# Patient Record
Sex: Male | Born: 1985
Health system: Southern US, Community
[De-identification: ages and names within clinical notes are randomized; demographics above are authoritative.]

## PROBLEM LIST (undated history)

## (undated) DIAGNOSIS — F840 Autistic disorder: Secondary | ICD-10-CM

## (undated) DIAGNOSIS — F909 Attention-deficit hyperactivity disorder, unspecified type: Secondary | ICD-10-CM

## (undated) DIAGNOSIS — R630 Anorexia: Secondary | ICD-10-CM

## (undated) DIAGNOSIS — R32 Unspecified urinary incontinence: Secondary | ICD-10-CM

## (undated) DIAGNOSIS — I10 Essential (primary) hypertension: Secondary | ICD-10-CM

## (undated) DIAGNOSIS — K5900 Constipation, unspecified: Secondary | ICD-10-CM

## (undated) HISTORY — DX: Attention-deficit hyperactivity disorder, unspecified type: F90.9

## (undated) HISTORY — PX: MOUTH SURGERY: SHX715

---
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
## 2007-01-25 ENCOUNTER — Emergency Department (HOSPITAL_COMMUNITY): Admission: EM | Admit: 2007-01-25 | Discharge: 2007-01-25 | Payer: Self-pay | Admitting: Emergency Medicine

## 2007-02-05 ENCOUNTER — Ambulatory Visit (HOSPITAL_COMMUNITY): Payer: Self-pay | Admitting: Psychiatry

## 2007-05-01 ENCOUNTER — Ambulatory Visit (HOSPITAL_COMMUNITY): Payer: Self-pay | Admitting: Psychiatry

## 2007-07-30 ENCOUNTER — Ambulatory Visit (HOSPITAL_COMMUNITY): Payer: Self-pay | Admitting: Psychiatry

## 2007-12-19 ENCOUNTER — Ambulatory Visit (HOSPITAL_COMMUNITY): Payer: Self-pay | Admitting: Psychiatry

## 2008-03-19 ENCOUNTER — Ambulatory Visit (HOSPITAL_COMMUNITY): Payer: Self-pay | Admitting: Psychiatry

## 2008-06-20 ENCOUNTER — Ambulatory Visit (HOSPITAL_COMMUNITY): Payer: Self-pay | Admitting: Psychiatry

## 2008-09-26 ENCOUNTER — Ambulatory Visit (HOSPITAL_COMMUNITY): Payer: Self-pay | Admitting: Psychiatry

## 2008-12-26 ENCOUNTER — Ambulatory Visit (HOSPITAL_COMMUNITY): Payer: Self-pay | Admitting: Psychiatry

## 2009-03-27 ENCOUNTER — Ambulatory Visit (HOSPITAL_COMMUNITY): Payer: Self-pay | Admitting: Psychiatry

## 2009-06-23 ENCOUNTER — Ambulatory Visit (HOSPITAL_COMMUNITY): Payer: Self-pay | Admitting: Psychiatry

## 2009-09-23 ENCOUNTER — Ambulatory Visit (HOSPITAL_COMMUNITY): Payer: Self-pay | Admitting: Psychiatry

## 2009-12-29 ENCOUNTER — Ambulatory Visit (HOSPITAL_COMMUNITY): Payer: Self-pay | Admitting: Psychiatry

## 2010-02-10 ENCOUNTER — Ambulatory Visit (HOSPITAL_COMMUNITY): Payer: Self-pay | Admitting: Psychiatry

## 2010-03-30 ENCOUNTER — Encounter (HOSPITAL_COMMUNITY): Payer: Medicaid Other | Admitting: Physician Assistant

## 2010-03-30 DIAGNOSIS — F909 Attention-deficit hyperactivity disorder, unspecified type: Secondary | ICD-10-CM

## 2010-04-21 ENCOUNTER — Inpatient Hospital Stay (HOSPITAL_COMMUNITY)
Admission: EM | Admit: 2010-04-21 | Discharge: 2010-04-22 | DRG: 918 | Disposition: A | Discharge status: Home / Self Care | Payer: Medicaid Other | Attending: Internal Medicine | Admitting: Internal Medicine

## 2010-04-21 DIAGNOSIS — T43501A Poisoning by unspecified antipsychotics and neuroleptics, accidental (unintentional), initial encounter: Principal | ICD-10-CM | POA: Diagnosis present

## 2010-04-21 DIAGNOSIS — T43591A Poisoning by other antipsychotics and neuroleptics, accidental (unintentional), initial encounter: Secondary | ICD-10-CM | POA: Diagnosis present

## 2010-04-21 DIAGNOSIS — F840 Autistic disorder: Secondary | ICD-10-CM | POA: Diagnosis present

## 2010-04-21 LAB — COMPREHENSIVE METABOLIC PANEL
AST: 28 U/L (ref 0–37)
BUN: 8 mg/dL (ref 6–23)
CO2: 26 mEq/L (ref 19–32)
Calcium: 9.7 mg/dL (ref 8.4–10.5)
Creatinine, Ser: 0.79 mg/dL (ref 0.4–1.5)
GFR calc Af Amer: >60 mL/min (ref >60)
GFR calc non Af Amer: >60 mL/min (ref >60)
Glucose, Bld: 97 mg/dL (ref 70–99)
Total Bilirubin: 0.5 mg/dL (ref 0.3–1.2)

## 2010-04-21 LAB — DIFFERENTIAL
Basophils Absolute: 0 10*3/uL (ref 0.0–0.1)
Eosinophils Relative: 1 % (ref 0–5)
Lymphocytes Relative: 20 % (ref 12–46)
Monocytes Absolute: 0.5 10*3/uL (ref 0.1–1.0)
Monocytes Relative: 5 % (ref 3–12)

## 2010-04-21 LAB — CBC
HCT: 39.6 % (ref 39.0–52.0)
MCH: 29.2 pg (ref 26.0–34.0)
MCHC: 34.8 g/dL (ref 30.0–36.0)
RDW: 13.1 % (ref 11.5–15.5)

## 2010-04-21 LAB — SALICYLATE LEVEL: Salicylate Lvl: <4 mg/dL (ref 2.8–20.0)

## 2010-04-21 LAB — ETHANOL: Alcohol, Ethyl (B): <5 mg/dL (ref 0–10)

## 2010-04-21 LAB — MRSA PCR SCREENING: MRSA by PCR: NEGATIVE

## 2010-04-22 LAB — CBC
Platelets: 208 10*3/uL (ref 150–400)
RBC: 4.69 MIL/uL (ref 4.22–5.81)
RDW: 13.4 % (ref 11.5–15.5)
WBC: 6.1 10*3/uL (ref 4.0–10.5)

## 2010-04-22 LAB — DIFFERENTIAL
Basophils Absolute: 0 10*3/uL (ref 0.0–0.1)
Basophils Relative: 0 % (ref 0–1)
Eosinophils Absolute: 0.1 10*3/uL (ref 0.0–0.7)
Eosinophils Relative: 1 % (ref 0–5)
Neutrophils Relative %: 59 % (ref 43–77)

## 2010-04-22 LAB — COMPREHENSIVE METABOLIC PANEL
AST: 26 U/L (ref 0–37)
Albumin: 3.9 g/dL (ref 3.5–5.2)
Alkaline Phosphatase: 67 U/L (ref 39–117)
BUN: 5 mg/dL — ABNORMAL LOW (ref 6–23)
Chloride: 109 mEq/L (ref 96–112)
GFR calc Af Amer: >60 mL/min (ref >60)
Potassium: 4.1 mEq/L (ref 3.5–5.1)
Total Bilirubin: 0.4 mg/dL (ref 0.3–1.2)
Total Protein: 6.6 g/dL (ref 6.0–8.3)

## 2010-04-23 NOTE — H&P (Signed)
  Logan [name], [name]              ACCOUNT NO.:  [id number]  MEDICAL RECORD NO.:  [id number]           PATIENT TYPE:  I  LOCATION:  IC04                          FACILITY:  [facility]  PHYSICIAN:  Logan [name], M.D.DATE OF BIRTH:  10-Jan-1986  DATE OF ADMISSION:  04/21/2010 DATE OF DISCHARGE:  LH                             HISTORY & PHYSICAL   CHIEF COMPLAINT:  Accidental overdose of Zyprexa.  HISTORY OF PRESENT ILLNESS:  This 25 year old autistic man was noted by his mother to have taken approximately 20-22 Zyprexa 15 mg tablets approximately 8 hours ago.  He found where his mother had placed these pills and must have taken them.  The patient is moderate to severely autistic and requires care by his mother.  He is normally ambulatory. The patient presented to the ER in a awake state and somewhat agitated. By the time I saw the patient, the patient has been given intravenous lorazepam and now is sleeping.  This was clearly not a suicidal attempt by this patient.  PAST MEDICAL HISTORY:  Autism.  SURGERIES:  No surgeries.  No other serious illnesses.  SOCIAL HISTORY:  The patient lives with his mother.  The patient also has another brother 88 years old who is also autistic.  FAMILY HISTORY:  As above.  MEDICATIONS: 1. Zyprexa 15 mg q.i.d. 2. Trazodone 50 mg nightly p.r.n.  ALLERGIES:  None.  REVIEW OF SYSTEMS:  The patient is some sleeping and unable to give me any clear review whatsoever and usually he is not communicative when awake.  PHYSICAL EXAMINATION:  VITAL SIGNS:  The patient is afebrile, temperature 132/90, pulse 100 and sinus rhythm, respiratory rate 14, saturation 100% on room air. GENERAL:  He looks quite comfortable at rest.  There is no peripheral or central cyanosis.  Peripheries are warm.  There is no clubbing.  There is no jaundice. CARDIOVASCULAR:  Heart sounds are present and normal without murmurs. RESPIRATORY:  Lung fields are clear. ABDOMEN:   Soft and nontender with no hepatosplenomegaly. NEUROLOGIC:  He is somnolescent at this moment but when he was observed earlier by the ER physician, there was no evidence of neurological abnormalities.  In fact he is not cooperative with the physical exam but eventually did cooperate with taking blood and placing an intravenous access.  This is apparently usual per the patient's mother.  INVESTIGATIONS:  Lab work shows hemoglobin 13.8, white blood cell count 10.4, platelets 232.  Salicylate level less than 4.0.  Acetaminophen level less than 10.0.  Sodium 141, potassium 4.0, bicarbonate 26, glucose 97, BUN 8, creatinine 0.79, alcohol level less than 5.  PROBLEM LIST: 1. Accidental overdose of olanzapine. 2. Autism.  PLAN: 1. Admit to telemetry. 2. Intravenous fluids. 3. Monitor for hypotension and tachycardia per Poison Control. 4. Further recommendations will depend on the patient's hospital     progress.     Logan [name], M.D.     NCG/MEDQ  D:  04/21/2010  T:  04/22/2010  Job:  [id number]  cc:   Annia [name]. [place], MDFax: [phone]  Electronically Signed by Logan [name] M.D. on 04/22/2010 02:22:51 PM

## 2010-04-23 NOTE — Discharge Summary (Signed)
  Logan [name], Logan [name]              ACCOUNT NO.:  [id number]  MEDICAL RECORD NO.:  [id number]           PATIENT TYPE:  I  LOCATION:  IC04                          FACILITY:  [facility]  PHYSICIAN:  Wilson Singer, M.D.DATE OF BIRTH:  1985-05-18  DATE OF ADMISSION:  04/21/2010 DATE OF DISCHARGE:  LH                              DISCHARGE SUMMARY   FINAL DISCHARGE DIAGNOSIS:  Accidental overdose of olanzapine 20-22 15 mg tablets.  CONDITION ON DISCHARGE:  Stable.  HISTORY:  This 25 year old Autistic man came in to the hospital yesterday having per his mother taken accidental overdose of olanzapine. The patient is severely autistic.  At the time of this discharge summary dictation, the patient is now 24 hours post probable ingestion of olanzapine and he has been stable overnight with no evidence of hypotension or significant tachycardia or arrhythmia.  Poison control was contacted yesterday and they alerted [place] to watch for these problems. There have been no such problems and poison control has been contacted again today and they feel that he is stable to be discharged based on my clinical findings.  They suggested that any abnormal movements or rigidity would be something to monitor.  His electrocardiogram this morning shows a sinus tachycardia and a QTC time is 452 milliseconds, in other words less than 500 milliseconds and I feel that he is safe to be discharged.  PHYSICAL EXAMINATION:  VITAL SIGNS:  Today, temperature 97.6, blood pressure 109/70, pulse 102 and saturations 99% on room air. CARDIAC:  Heart sounds are present and normal with sinus tachycardia. LUNGS:  Lung fields are clear. ABDOMEN:  Soft and nontender. NEUROLOGICAL:  He is alert and he appears to be back to his baseline state according to his mother.  He is normally not very communicative.  LABORATORY WORK:  Today show a sodium of 145, potassium 4.1, bicarbonate 27, BUN 5, creatinine 0.85, glucose 95, hemoglobin  13.4, white blood cell count 6.1, platelets 208.  DISPOSITION:  The patient is stable to be discharged home and I have told his mother that as far as today is concerned, I would probably not recommend that she gave him any Zyprexa as half life can be up to 50 hours.  Probably, it is reasonable to start his usual dose of Zyprexa 15 mg 4 times a day tomorrow.  She will watch him closely as she usually does.     Wilson Singer, M.D.     NCG/MEDQ  D:  04/22/2010  T:  04/22/2010  Job:  [id number]  cc:   Annia Friendly. Loleta Chance, MD Fax: [phone]  Electronically Signed by Lilly Cove M.D. on 04/22/2010 02:23:10 PM

## 2010-06-22 ENCOUNTER — Encounter (HOSPITAL_COMMUNITY): Payer: Medicaid Other | Admitting: Physician Assistant

## 2010-06-22 DIAGNOSIS — F909 Attention-deficit hyperactivity disorder, unspecified type: Secondary | ICD-10-CM

## 2010-09-21 ENCOUNTER — Encounter (HOSPITAL_COMMUNITY): Payer: Medicare Other | Admitting: Physician Assistant

## 2010-09-21 DIAGNOSIS — F909 Attention-deficit hyperactivity disorder, unspecified type: Secondary | ICD-10-CM

## 2010-12-22 ENCOUNTER — Encounter (HOSPITAL_COMMUNITY): Payer: Medicare Other | Admitting: Physician Assistant

## 2011-01-04 ENCOUNTER — Ambulatory Visit (HOSPITAL_COMMUNITY): Payer: Medicare Other | Admitting: Physician Assistant

## 2011-01-28 ENCOUNTER — Other Ambulatory Visit (HOSPITAL_COMMUNITY): Payer: Self-pay | Admitting: Physician Assistant

## 2011-01-28 ENCOUNTER — Other Ambulatory Visit (HOSPITAL_COMMUNITY): Payer: Self-pay

## 2011-01-28 DIAGNOSIS — F39 Unspecified mood [affective] disorder: Secondary | ICD-10-CM

## 2011-01-28 MED ORDER — TRAZODONE HCL 50 MG PO TABS: 50.0000 mg | ORAL_TABLET | Freq: Every day | ORAL | Status: DC

## 2011-02-17 ENCOUNTER — Other Ambulatory Visit (HOSPITAL_COMMUNITY): Payer: Self-pay | Admitting: Physician Assistant

## 2011-02-17 MED ORDER — OLANZAPINE 15 MG PO TBDP: 15.0000 mg | ORAL_TABLET | Freq: Four times a day (QID) | ORAL | Status: DC | PRN

## 2011-03-02 ENCOUNTER — Ambulatory Visit (HOSPITAL_COMMUNITY): Payer: Medicare Other | Admitting: Physician Assistant

## 2011-03-02 ENCOUNTER — Ambulatory Visit (INDEPENDENT_AMBULATORY_CARE_PROVIDER_SITE_OTHER): Payer: Medicare Other | Admitting: Physician Assistant

## 2011-03-02 DIAGNOSIS — F848 Other pervasive developmental disorders: Secondary | ICD-10-CM

## 2011-03-02 DIAGNOSIS — F909 Attention-deficit hyperactivity disorder, unspecified type: Secondary | ICD-10-CM | POA: Diagnosis not present

## 2011-03-02 MED ORDER — AMPHETAMINE-DEXTROAMPHETAMINE 20 MG PO TABS: 20.0000 mg | ORAL_TABLET | Freq: Every day | ORAL | Status: DC

## 2011-03-07 NOTE — Progress Notes (Signed)
   [facility] Follow-up Outpatient Visit  Logan [name] 1985/02/25  Date: 03/02/11   Subjective: Logan [name] was seen with his mother and brother today. Logan [name]'s mother reports that overall Logan [name] is doing well, but today he is misbehaving. She has decreased the dose of Adderall that she has been giving him and he seems to be doing well  There were no vitals filed for this visit.  Mental Status Examination  Appearance: Casual Alert: Yes Attention: poor Cooperative: No Eye Contact: Poor Speech: Absent Psychomotor Activity: Increased Memory/Concentration: Absent Oriented: Unable to determine Mood: Anxious Affect: Congruent Thought Processes and Associations: Unable to determine Fund of Knowledge: Poor Thought Content:  Insight: Poor Judgement: Poor  Diagnosis: ADHD, combined type; autism  Treatment Plan: Decrease dose of Adderall. Continue other medications as prescribed. Followup in 3 months  Kit Mollett, PA

## 2011-03-22 ENCOUNTER — Other Ambulatory Visit (HOSPITAL_COMMUNITY): Payer: Self-pay | Admitting: Psychology

## 2011-03-22 MED ORDER — OLANZAPINE 15 MG PO TBDP: 15.0000 mg | ORAL_TABLET | Freq: Four times a day (QID) | ORAL | Status: DC | PRN

## 2011-04-20 ENCOUNTER — Other Ambulatory Visit (HOSPITAL_COMMUNITY): Payer: Self-pay | Admitting: *Deleted

## 2011-04-20 DIAGNOSIS — F901 Attention-deficit hyperactivity disorder, predominantly hyperactive type: Secondary | ICD-10-CM

## 2011-04-20 MED ORDER — OLANZAPINE 15 MG PO TBDP: 15.0000 mg | ORAL_TABLET | Freq: Four times a day (QID) | ORAL | Status: DC | PRN

## 2011-05-22 ENCOUNTER — Emergency Department (HOSPITAL_COMMUNITY)
Admission: EM | Admit: 2011-05-22 | Discharge: 2011-05-22 | Disposition: A | Discharge status: Home / Self Care | Payer: Medicare Other | Attending: Emergency Medicine | Admitting: Emergency Medicine

## 2011-05-22 ENCOUNTER — Encounter (HOSPITAL_COMMUNITY): Payer: Self-pay

## 2011-05-22 DIAGNOSIS — F840 Autistic disorder: Secondary | ICD-10-CM | POA: Diagnosis not present

## 2011-05-22 DIAGNOSIS — S61209A Unspecified open wound of unspecified finger without damage to nail, initial encounter: Secondary | ICD-10-CM | POA: Diagnosis not present

## 2011-05-22 DIAGNOSIS — S61219A Laceration without foreign body of unspecified finger without damage to nail, initial encounter: Secondary | ICD-10-CM

## 2011-05-22 DIAGNOSIS — W268XXA Contact with other sharp object(s), not elsewhere classified, initial encounter: Secondary | ICD-10-CM | POA: Insufficient documentation

## 2011-05-22 HISTORY — DX: Autistic disorder: F84.0

## 2011-05-22 MED ORDER — TETANUS-DIPHTH-ACELL PERTUSSIS 5-2.5-18.5 LF-MCG/0.5 IM SUSP
0.5000 mL | Freq: Once | INTRAMUSCULAR | Status: AC
  Administered 2011-05-22: 0.5 mL via INTRAMUSCULAR
  Filled 2011-05-22: qty 0.5

## 2011-05-22 NOTE — ED Notes (Signed)
Cut left hand index finger on metal can

## 2011-05-22 NOTE — Discharge Instructions (Signed)
Laceration Care, Adult °A laceration is a cut that goes through all layers of the skin. The cut goes into the tissue beneath the skin. °HOME CARE °For stitches (sutures) or staples: °· Keep the cut clean and dry.  °· If you have a bandage (dressing), change it at least once a day. Change the bandage if it gets wet or dirty, or as told by your doctor.  °· Wash the cut with soap and water 2 times a day. Rinse the cut with water. Pat it dry with a clean towel.  °· Put a thin layer of medicated cream on the cut as told by your doctor.  °· You may shower after the first 24 hours. Do not soak the cut in water until the stitches are removed.  °· Only take medicines as told by your doctor.  °· Have your stitches or staples removed as told by your doctor.  °For skin adhesive strips: °· Keep the cut clean and dry.  °· Do not get the strips wet. You may take a bath, but be careful to keep the cut dry.  °· If the cut gets wet, pat it dry with a clean towel.  °· The strips will fall off on their own. Do not remove the strips that are still stuck to the cut.  °For wound glue: °· You may shower or take baths. Do not soak or scrub the cut. Do not swim. Avoid heavy sweating until the glue falls off on its own. After a shower or bath, pat the cut dry with a clean towel.  °· Do not put medicine on your cut until the glue falls off.  °· If you have a bandage, do not put tape over the glue.  °· Avoid lots of sunlight or tanning lamps until the glue falls off. Put sunscreen on the cut for the first year to reduce your scar.  °· The glue will fall off on its own. Do not pick at the glue.  °You may need a tetanus shot if: °· You cannot remember when you had your last tetanus shot.  °· You have never had a tetanus shot.  °If you need a tetanus shot and you choose not to have one, you may get tetanus. Sickness from tetanus can be serious. °GET HELP RIGHT AWAY IF:  °· Your pain does not get better with medicine.  °· Your arm, hand, leg, or  foot loses feeling (numbness) or changes color.  °· Your cut is bleeding.  °· Your joint feels weak, or you cannot use your joint.  °· You have painful lumps on your body.  °· Your cut is red, puffy (swollen), or painful.  °· You have a red line on the skin near the cut.  °· You have yellowish-white fluid (pus) coming from the cut.  °· You have a fever.  °· You have a bad smell coming from the cut or bandage.  °· Your cut breaks open before or after stitches are removed.  °· You notice something coming out of the cut, such as wood or glass.  °· You cannot move a finger or toe.  °MAKE SURE YOU:  °· Understand these instructions.  °· Will watch your condition.  °· Will get help right away if you are not doing well or get worse.  °Document Released: 07/20/2007 Document Revised: 01/20/2011 Document Reviewed: 07/27/2010 °[employer]® Patient Information ©2012 [employer]. °

## 2011-05-22 NOTE — ED Notes (Addendum)
Severely autistic young man presents with a small laceration to the left index finger. Laceration obtained from a fruit can top. Bleeding controlled with gauze. Mother placed a sock over pt's hand so as he would not "pick at cut".  Pt is standing in corner of room with back to nurse. Pt is standing with eyes closed and drooling. Mother of said pt states " I gave him, his medicine before we came". Mother goes on to state the last time he was here "he had to be sedated and restrained". Pt is very strong and continued to pull his hand away so as it made it difficult to examine finger laceration.

## 2011-05-22 NOTE — ED Notes (Signed)
Pt held by mom and 2 others to apply steri-strips and dermabond. Pt tolerated well.

## 2011-05-25 ENCOUNTER — Other Ambulatory Visit (HOSPITAL_COMMUNITY): Payer: Self-pay | Admitting: *Deleted

## 2011-05-25 DIAGNOSIS — F39 Unspecified mood [affective] disorder: Secondary | ICD-10-CM

## 2011-05-25 DIAGNOSIS — F901 Attention-deficit hyperactivity disorder, predominantly hyperactive type: Secondary | ICD-10-CM

## 2011-05-25 MED ORDER — TRAZODONE HCL 50 MG PO TABS: 50.0000 mg | ORAL_TABLET | Freq: Every day | ORAL | Status: DC

## 2011-05-25 MED ORDER — OLANZAPINE 15 MG PO TBDP: 15.0000 mg | ORAL_TABLET | Freq: Four times a day (QID) | ORAL | Status: DC | PRN

## 2011-05-25 NOTE — ED Provider Notes (Signed)
History     CSN: [id number]  Arrival date & time 05/22/11  1255   First MD Initiated Contact with Patient 05/22/11 1315      Chief Complaint  Patient presents with  . Laceration    (Consider location/radiation/quality/duration/timing/severity/associated sxs/prior treatment) HPI Comments: Patient with a hx of autism, mother c/o laceration to his left index finger after trying to open a metal can.  States she was unable to control the bleeding at home.    Patient is a 26 y.o. male presenting with skin laceration. The history is provided by a parent.  Laceration  The incident occurred less than 1 hour ago. Pain location: left index finger. The laceration is 1 cm in size. The laceration mechanism was a a metal edge. The patient is experiencing no pain. He reports no foreign bodies present. His tetanus status is unknown.    Past Medical History  Diagnosis Date  . Autism     History reviewed. No pertinent past surgical history.  No family history on file.  History  Substance Use Topics  . Smoking status: Never Smoker   . Smokeless tobacco: Not on file  . Alcohol Use: No      Review of Systems  Unable to perform ROS   Allergies  Review of patient's allergies indicates not on file.  Home Medications   Current Outpatient Rx  Name Route Sig Dispense Refill  . AMPHETAMINE-DEXTROAMPHETAMINE 20 MG PO TABS Oral Take 1 tablet (20 mg total) by mouth daily. 30 tablet 0  . AMPHETAMINE-DEXTROAMPHETAMINE 20 MG PO TABS Oral Take 1 tablet (20 mg total) by mouth daily. 30 tablet 0    Fill after 03/29/11  . AMPHETAMINE-DEXTROAMPHETAMINE 20 MG PO TABS Oral Take 1 tablet (20 mg total) by mouth daily. 30 tablet 0    Fill after 04/26/11  . OLANZAPINE 15 MG PO TBDP Oral Take 1 tablet (15 mg total) by mouth 4 (four) times daily as needed. 120 tablet 0  . TRAZODONE HCL 50 MG PO TABS Oral Take 1 tablet (50 mg total) by mouth at bedtime. 30 tablet 2    BP 114/76  Pulse 110  Temp 98 F (36.7  C)  Resp 20  Wt 145 lb (65.772 kg)  SpO2 100%  Physical Exam  Nursing note and vitals reviewed. Constitutional: He appears well-developed and well-nourished. No distress.  Musculoskeletal: He exhibits no edema and no tenderness.       Left hand: He exhibits laceration. He exhibits normal range of motion, no bony tenderness, normal two-point discrimination, normal capillary refill, no deformity and no swelling. normal sensation noted. Normal strength noted.       Hands:      1.5 cm superfical lac to the palmar aspect of the distal left index finger.  Mild, persistent bleeding.  Bleeding controlled with pressure but Pt continues to move the  finger and bleeding resumes.    Neurological: He is alert.  Skin: Skin is warm and dry.       See MS exam    ED Course  Procedures (including critical care time)    1. Laceration of finger    Wound was cleaned by me, hemostasis obtained with application of quick clot dressing.    Td updated   MDM    2 cm superfical laceration to the left index finger with persistent bleeding.  Pt maintained full ROM , sensation to the finger.  No nail injury.  Mother agrees to remove the dressing within 24 hrs,  and return here if needed  Wound(s) explored with adequate hemostasis through ROM, no apparent gross foreign body retained, no significant involvement of deep structures such as bone / joint / tendon / or neurovascular involvement noted.  Baseline Strength and Sensation to affected extremity(ies) with normal light touch for Pt, distal NVI with CR< 2 secs and pulse(s) intact to affected extremity(ies).   Patient / Family / Caregiver understand and agree with initial ED impression and plan with expectations set for ED visit. Pt stable in ED with no significant deterioration in condition. Pt feels improved after observation and/or treatment in ED.    Jamyia Fortune L. Shahed Yeoman, [place] 05/25/11 1351

## 2011-05-27 NOTE — ED Provider Notes (Signed)
Medical screening examination/treatment/procedure(s) were performed by non-physician practitioner and as supervising physician I was immediately available for consultation/collaboration.   Lavi Sheehan L Amber Guthridge, MD 05/27/11 1402 

## 2011-05-31 ENCOUNTER — Ambulatory Visit (INDEPENDENT_AMBULATORY_CARE_PROVIDER_SITE_OTHER): Payer: Medicare Other | Admitting: Physician Assistant

## 2011-05-31 DIAGNOSIS — F909 Attention-deficit hyperactivity disorder, unspecified type: Secondary | ICD-10-CM | POA: Insufficient documentation

## 2011-05-31 DIAGNOSIS — F840 Autistic disorder: Secondary | ICD-10-CM | POA: Insufficient documentation

## 2011-05-31 MED ORDER — AMPHETAMINE-DEXTROAMPHETAMINE 20 MG PO TABS: 20.0000 mg | ORAL_TABLET | Freq: Every day | ORAL | Status: DC

## 2011-05-31 NOTE — Progress Notes (Signed)
   [facility] Follow-up Outpatient Visit  Jakeob Tullis Oguin 10-Nov-1985  Date: 05/31/2011   Subjective: Dilon presents today with his mother and brother to followup on medications prescribed for ADHD and autism. Mother reports that his behavior has been poor, but he is rarely aggressive. They had to go to the emergency room because he cut his finger on a tin can and they were unable to control the bleeding. Mother reports that his sleep is good, but his appetite is somewhat down, and he has lost some weight.  There were no vitals filed for this visit.  Mental Status Examination  Appearance: Well groomed and casually dressed Alert: Yes Attention: good  Cooperative: Yes Eye Contact: None Speech: Absent Psychomotor Activity: Normal Memory/Concentration: Undetermined Oriented: Undetermined Mood: Dysphoric Affect: Congruent Thought Processes and Associations: Undetermined Fund of Knowledge: Poor Thought Content: Undetermined Insight: Poor Judgement: Poor  Diagnosis: ADHD, combined type; autism  Treatment Plan: We will continue his Adderall 20 mg daily, trazodone 50 mg at bedtime, and Zyprexa situs 15 mg 4 times daily as needed. He will return for followup in 3 months.  Stephnie Parlier, PA-C

## 2011-06-28 ENCOUNTER — Other Ambulatory Visit (HOSPITAL_COMMUNITY): Payer: Self-pay | Admitting: *Deleted

## 2011-06-28 DIAGNOSIS — F901 Attention-deficit hyperactivity disorder, predominantly hyperactive type: Secondary | ICD-10-CM

## 2011-06-28 MED ORDER — OLANZAPINE 15 MG PO TBDP: 15.0000 mg | ORAL_TABLET | Freq: Four times a day (QID) | ORAL | Status: DC | PRN

## 2011-07-27 ENCOUNTER — Other Ambulatory Visit (HOSPITAL_COMMUNITY): Payer: Self-pay

## 2011-07-27 DIAGNOSIS — F39 Unspecified mood [affective] disorder: Secondary | ICD-10-CM

## 2011-07-27 MED ORDER — TRAZODONE HCL 50 MG PO TABS: 50.0000 mg | ORAL_TABLET | Freq: Every day | ORAL | Status: DC

## 2011-08-16 ENCOUNTER — Ambulatory Visit (INDEPENDENT_AMBULATORY_CARE_PROVIDER_SITE_OTHER): Payer: Medicare Other | Admitting: Physician Assistant

## 2011-08-16 DIAGNOSIS — F909 Attention-deficit hyperactivity disorder, unspecified type: Secondary | ICD-10-CM | POA: Diagnosis not present

## 2011-08-16 DIAGNOSIS — F848 Other pervasive developmental disorders: Secondary | ICD-10-CM | POA: Diagnosis not present

## 2011-08-16 MED ORDER — AMPHETAMINE-DEXTROAMPHETAMINE 20 MG PO TABS: 20.0000 mg | ORAL_TABLET | Freq: Every day | ORAL | Status: DC

## 2011-08-16 NOTE — Progress Notes (Signed)
   [facility] Follow-up Outpatient Visit  Logan [name] 1985/11/06  Date: 08/16/2011   Subjective: Logan [name] presents with his mother and brother today to followup on his treatment for ADHD and autism. Mother reports that has been giving her a hard time recently, and states "I figured out what is wrong with him. He is just mean." She reports that all in all he is doing well. He sleeps well, and his appetite is good. He does not like to leave the house, but once he gets out he is fine.  There were no vitals filed for this visit.  Mental Status Examination  Appearance: Fairly groomed and casually dressed Alert: Yes Attention: fair  Cooperative: No Eye Contact: Fair Speech: Absent Psychomotor Activity: Normal Memory/Concentration: Undetermined Oriented: Undetermined Mood: Euthymic Affect: Congruent Thought Processes and Associations: Undetermined Fund of Knowledge: Poor Thought Content: Undetermined Insight: Poor Judgement: Fair  Diagnosis: ADHD, autism spectrum disorder  Treatment Plan: We will continue his Adderall 20 mg daily, trazodone 50 mg at bedtime, and Zyprexa Zydis 15 mg 4 times daily as needed. He'll return for followup in 3 months  Rasa Degrazia, PA-C

## 2011-08-30 ENCOUNTER — Other Ambulatory Visit (HOSPITAL_COMMUNITY): Payer: Self-pay | Admitting: Psychology

## 2011-08-30 DIAGNOSIS — F39 Unspecified mood [affective] disorder: Secondary | ICD-10-CM

## 2011-08-30 MED ORDER — TRAZODONE HCL 50 MG PO TABS: 50.0000 mg | ORAL_TABLET | Freq: Every day | ORAL | Status: DC

## 2011-10-04 ENCOUNTER — Other Ambulatory Visit (HOSPITAL_COMMUNITY): Payer: Self-pay | Admitting: *Deleted

## 2011-10-04 DIAGNOSIS — F901 Attention-deficit hyperactivity disorder, predominantly hyperactive type: Secondary | ICD-10-CM

## 2011-10-04 MED ORDER — OLANZAPINE 15 MG PO TBDP: 15.0000 mg | ORAL_TABLET | Freq: Four times a day (QID) | ORAL | Status: DC | PRN

## 2011-11-07 ENCOUNTER — Other Ambulatory Visit (HOSPITAL_COMMUNITY): Payer: Self-pay | Admitting: *Deleted

## 2011-11-07 DIAGNOSIS — F39 Unspecified mood [affective] disorder: Secondary | ICD-10-CM

## 2011-11-07 MED ORDER — TRAZODONE HCL 50 MG PO TABS: 50.0000 mg | ORAL_TABLET | Freq: Every day | ORAL | Status: DC

## 2011-11-16 ENCOUNTER — Ambulatory Visit (HOSPITAL_COMMUNITY): Payer: Self-pay | Admitting: Physician Assistant

## 2011-11-23 ENCOUNTER — Ambulatory Visit (INDEPENDENT_AMBULATORY_CARE_PROVIDER_SITE_OTHER): Payer: Medicare Other | Admitting: Physician Assistant

## 2011-11-23 DIAGNOSIS — F840 Autistic disorder: Secondary | ICD-10-CM

## 2011-11-23 DIAGNOSIS — F909 Attention-deficit hyperactivity disorder, unspecified type: Secondary | ICD-10-CM

## 2011-11-23 DIAGNOSIS — F901 Attention-deficit hyperactivity disorder, predominantly hyperactive type: Secondary | ICD-10-CM

## 2011-11-23 DIAGNOSIS — F988 Other specified behavioral and emotional disorders with onset usually occurring in childhood and adolescence: Secondary | ICD-10-CM

## 2011-11-23 MED ORDER — AMPHETAMINE-DEXTROAMPHETAMINE 20 MG PO TABS: 20.0000 mg | ORAL_TABLET | Freq: Every day | ORAL | Status: DC

## 2011-11-23 MED ORDER — OLANZAPINE 15 MG PO TBDP: 15.0000 mg | ORAL_TABLET | Freq: Four times a day (QID) | ORAL | Status: DC | PRN

## 2011-11-23 NOTE — Progress Notes (Signed)
   [facility] Follow-up Outpatient Visit  Logan [name] [birth year]  Date: 11/23/2011   Subjective: Logan [name]'s mother came in today without chart to followup on his treatment for ADHD and autism. She reports that Logan [name] is doing very well, except he gets aggravated by his brother Casimiro Needle.  There were no vitals filed for this visit.  Mental Status Examination  Appearance: Not present Alert: Not present Attention: Not present Cooperative: Not present Eye Contact: Not present Speech: Not present Psychomotor Activity: Not present Memory/Concentration: Not present Oriented: Not present Mood: Not present Affect: Not present Thought Processes and Associations: Not present Fund of Knowledge: Not present Thought Content: Not present Insight: Not present Judgement: Not present  Diagnosis: Autism, ADHD  Treatment Plan: We will continue his Adderall 20 mg daily, Zyprexa Zydis 15 mg 4 times daily as needed, and trazodone 50 mg at bedtime. He'll return for followup in 3 months.  Jahliyah Trice, PA-C

## 2011-11-29 DIAGNOSIS — F72 Severe intellectual disabilities: Secondary | ICD-10-CM | POA: Diagnosis not present

## 2012-02-03 ENCOUNTER — Other Ambulatory Visit (HOSPITAL_COMMUNITY): Payer: Self-pay | Admitting: Physician Assistant

## 2012-02-03 ENCOUNTER — Telehealth (HOSPITAL_COMMUNITY): Payer: Self-pay

## 2012-02-03 DIAGNOSIS — F39 Unspecified mood [affective] disorder: Secondary | ICD-10-CM

## 2012-02-03 MED ORDER — TRAZODONE HCL 50 MG PO TABS: 50.0000 mg | ORAL_TABLET | Freq: Every day | ORAL | Status: DC

## 2012-02-23 ENCOUNTER — Ambulatory Visit (INDEPENDENT_AMBULATORY_CARE_PROVIDER_SITE_OTHER): Payer: Medicare Other | Admitting: Physician Assistant

## 2012-02-23 DIAGNOSIS — F909 Attention-deficit hyperactivity disorder, unspecified type: Secondary | ICD-10-CM

## 2012-02-23 DIAGNOSIS — F840 Autistic disorder: Secondary | ICD-10-CM | POA: Diagnosis not present

## 2012-02-23 MED ORDER — AMPHETAMINE-DEXTROAMPHETAMINE 20 MG PO TABS: 20.0000 mg | ORAL_TABLET | Freq: Every day | ORAL | Status: DC

## 2012-02-23 NOTE — Progress Notes (Signed)
   [facility] Follow-up Outpatient Visit  Ritesh Opara Latulippe July 18, 1985  Date: 02/23/2012   Subjective: Jaun presents today with his brother and mother to followup on his treatment for ADHD and autism. Mother reports he is doing extremely well. There have been no behavioral problems.  There were no vitals filed for this visit.  Mental Status Examination  Appearance: Casual Alert: Yes Attention: fair  Cooperative: Yes Eye Contact: None Speech: Absent Psychomotor Activity: Normal Memory/Concentration: Undetermined Oriented: Undetermined Mood: Euthymic Affect: Congruent Thought Processes and Associations: Undetermined Fund of Knowledge: Poor Thought Content: Undetermined Insight: Poor Judgement: Fair  Diagnosis: ADHD, combined type; autistic disorder  Treatment Plan: We will continue his Adderall 20 mg daily, trazodone 50 mg at bedtime, and Zyprexa Zydis 15 mg 4 times daily as needed. He will return for followup in 3 months.  Nero Sawatzky, PA-C

## 2012-02-28 DIAGNOSIS — F72 Severe intellectual disabilities: Secondary | ICD-10-CM | POA: Diagnosis not present

## 2012-03-13 ENCOUNTER — Other Ambulatory Visit (HOSPITAL_COMMUNITY): Payer: Self-pay | Admitting: Physician Assistant

## 2012-03-13 DIAGNOSIS — F901 Attention-deficit hyperactivity disorder, predominantly hyperactive type: Secondary | ICD-10-CM

## 2012-03-13 MED ORDER — OLANZAPINE 15 MG PO TBDP: 15.0000 mg | ORAL_TABLET | Freq: Four times a day (QID) | ORAL | Status: DC | PRN

## 2012-05-23 ENCOUNTER — Ambulatory Visit (INDEPENDENT_AMBULATORY_CARE_PROVIDER_SITE_OTHER): Payer: Medicare Other | Admitting: Physician Assistant

## 2012-05-23 DIAGNOSIS — F909 Attention-deficit hyperactivity disorder, unspecified type: Secondary | ICD-10-CM

## 2012-05-23 DIAGNOSIS — F840 Autistic disorder: Secondary | ICD-10-CM

## 2012-05-23 MED ORDER — AMPHETAMINE-DEXTROAMPHETAMINE 20 MG PO TABS: 20.0000 mg | ORAL_TABLET | Freq: Every day | ORAL | Status: DC

## 2012-05-23 NOTE — Progress Notes (Signed)
   [facility] Follow-up Outpatient Visit  Skylier Kretschmer Stys Sep 26, 1985  Date: 05/23/2012   Subjective: Brownie presents today with his mother and brother to followup on his treatment for ADHD and autism. Mother reports that everyone is doing well. Chart has an occasional flare, but overall is doing well. She reports that his appetite has been decreased somewhat recently, but he continues to eat sweets without reservation.  There were no vitals filed for this visit.  Mental Status Examination  Appearance: Casual  Alert: Yes  Attention: fair  Cooperative: Yes  Eye Contact: None  Speech: Absent  Psychomotor Activity: Normal  Memory/Concentration: Undetermined  Oriented: Undetermined  Mood: Euthymic  Affect: Congruent  Thought Processes and Associations: Undetermined  Fund of Knowledge: Poor  Thought Content: Undetermined  Insight: Poor  Judgement: Fair   Diagnosis: ADHD, combined type; autistic disorder   Treatment Plan: We will continue his Adderall 20 mg daily, trazodone 50 mg at bedtime, and Zyprexa Zydis 15 mg 4 times daily as needed. He will return for followup in 3 months.  Carena Stream, PA-C

## 2012-05-31 DIAGNOSIS — F840 Autistic disorder: Secondary | ICD-10-CM | POA: Diagnosis not present

## 2012-06-12 ENCOUNTER — Other Ambulatory Visit (HOSPITAL_COMMUNITY): Payer: Self-pay | Admitting: *Deleted

## 2012-06-12 DIAGNOSIS — F901 Attention-deficit hyperactivity disorder, predominantly hyperactive type: Secondary | ICD-10-CM

## 2012-06-12 MED ORDER — OLANZAPINE 15 MG PO TBDP: 15.0000 mg | ORAL_TABLET | Freq: Four times a day (QID) | ORAL | Status: DC | PRN

## 2012-08-13 ENCOUNTER — Other Ambulatory Visit (HOSPITAL_COMMUNITY): Payer: Self-pay | Admitting: *Deleted

## 2012-08-13 DIAGNOSIS — F39 Unspecified mood [affective] disorder: Secondary | ICD-10-CM

## 2012-08-13 MED ORDER — TRAZODONE HCL 50 MG PO TABS: 50.0000 mg | ORAL_TABLET | Freq: Every day | ORAL | Status: DC

## 2012-08-22 ENCOUNTER — Ambulatory Visit (HOSPITAL_COMMUNITY): Payer: Self-pay | Admitting: Physician Assistant

## 2012-09-04 ENCOUNTER — Ambulatory Visit (INDEPENDENT_AMBULATORY_CARE_PROVIDER_SITE_OTHER): Payer: Medicare Other | Admitting: Physician Assistant

## 2012-09-04 ENCOUNTER — Encounter (HOSPITAL_COMMUNITY): Payer: Self-pay | Admitting: Physician Assistant

## 2012-09-04 VITALS — BP 126/101 | HR 115 | Ht 66.0 in | Wt 132.6 lb

## 2012-09-04 DIAGNOSIS — F840 Autistic disorder: Secondary | ICD-10-CM | POA: Diagnosis not present

## 2012-09-04 DIAGNOSIS — F909 Attention-deficit hyperactivity disorder, unspecified type: Secondary | ICD-10-CM | POA: Diagnosis not present

## 2012-09-04 MED ORDER — AMPHETAMINE-DEXTROAMPHETAMINE 20 MG PO TABS: 20.0000 mg | ORAL_TABLET | Freq: Every day | ORAL | Status: DC

## 2012-09-04 NOTE — Progress Notes (Signed)
   [facility] Follow-up Outpatient Visit  Logan [name] 04-30-1985  Date: 09/04/2012   Subjective: Logan [name] presents today with his mother and brother to followup on his treatment for ADHD and autism. Mother reports that he is doing very well. His behavior has been good His appetite is good. He is sleeping well.He will turn 27 and a couple of days.   Filed Vitals:   09/04/12 1353  BP: 126/101  Pulse: 115    Mental Status Examination  Appearance: casual Alert: Yes Attention: fair  Cooperative: Yes Eye Contact: None Speech: absent Psychomotor Activity: Restlessness Memory/Concentration: undetermined Oriented: undetermined Mood: Euthymic Affect: Congruent Thought Processes and Associations: Undetermined Fund of Knowledge: Poor Thought Content: undetermined Insight: Poor Judgement: Fair  Diagnosis: ADHD, combined type; autism  Treatment Plan: We will continue his Adderall 20 mg daily, trazodone 50 mg at bedtime, and Zyprexa Zydis 15 mg 4 times daily as needed. He will return for followup in 3 months.   Logan Reedy, PA-C

## 2012-09-10 DIAGNOSIS — L259 Unspecified contact dermatitis, unspecified cause: Secondary | ICD-10-CM | POA: Diagnosis not present

## 2012-09-13 ENCOUNTER — Other Ambulatory Visit (HOSPITAL_COMMUNITY): Payer: Self-pay | Admitting: *Deleted

## 2012-09-13 DIAGNOSIS — F901 Attention-deficit hyperactivity disorder, predominantly hyperactive type: Secondary | ICD-10-CM

## 2012-09-13 MED ORDER — OLANZAPINE 15 MG PO TBDP: 15.0000 mg | ORAL_TABLET | Freq: Four times a day (QID) | ORAL | Status: DC | PRN

## 2012-10-17 ENCOUNTER — Other Ambulatory Visit (HOSPITAL_COMMUNITY): Payer: Self-pay | Admitting: Physician Assistant

## 2012-11-13 ENCOUNTER — Other Ambulatory Visit (HOSPITAL_COMMUNITY): Payer: Self-pay | Admitting: Physician Assistant

## 2012-11-19 ENCOUNTER — Other Ambulatory Visit (HOSPITAL_COMMUNITY): Payer: Self-pay | Admitting: *Deleted

## 2012-11-19 MED ORDER — OLANZAPINE 15 MG PO TBDP: ORAL_TABLET | ORAL | Status: DC

## 2012-12-06 ENCOUNTER — Encounter (HOSPITAL_COMMUNITY): Payer: Self-pay | Admitting: Physician Assistant

## 2012-12-06 ENCOUNTER — Ambulatory Visit (INDEPENDENT_AMBULATORY_CARE_PROVIDER_SITE_OTHER): Payer: Medicare Other | Admitting: Physician Assistant

## 2012-12-06 VITALS — BP 131/98 | HR 117 | Ht 66.0 in | Wt 143.0 lb

## 2012-12-06 DIAGNOSIS — F909 Attention-deficit hyperactivity disorder, unspecified type: Secondary | ICD-10-CM

## 2012-12-06 DIAGNOSIS — F840 Autistic disorder: Secondary | ICD-10-CM

## 2012-12-06 MED ORDER — AMPHETAMINE-DEXTROAMPHETAMINE 20 MG PO TABS: 20.0000 mg | ORAL_TABLET | Freq: Every day | ORAL | Status: DC

## 2012-12-06 NOTE — Progress Notes (Signed)
   [facility] Follow-up Outpatient Visit  Logan [name] 1985/11/01  Date: 12/06/2012   Subjective: Logan [name] presents today with his mother to followup on history that for autism and ADHD. Mother reports that all is going well. She was surprised the chart he came outside on his own today, as that is not his normal behavior. He seems more upbeat and happy as is evidenced by his dancing behavior.  Filed Vitals:   12/06/12 1428  BP: 131/98  Pulse: 117    Mental Status Examination  Appearance: Casual Alert: Yes Attention: good  Cooperative: Yes Eye Contact: Fair Speech: Absent Psychomotor Activity: Restlessness Memory/Concentration: Undetermined Oriented: Undetermined Mood: Euthymic Affect: Congruent Thought Processes and Associations: Undetermined Fund of Knowledge: Poor Thought Content: Undetermined Insight: Poor Judgement: Fair  Diagnosis: Autism, ADHD combined type  Treatment Plan: We will continue Adderall 20 mg daily, Zyprexa Zydis 15 mg 4 times daily, and trazodone 50 mg at bedtime. Jackey will return for a followup in 3 months with Dr. Tenny Craw.  Graycie Halley, PA-C

## 2012-12-12 DIAGNOSIS — F72 Severe intellectual disabilities: Secondary | ICD-10-CM | POA: Diagnosis not present

## 2012-12-19 ENCOUNTER — Encounter (HOSPITAL_COMMUNITY): Payer: Self-pay | Admitting: Psychiatry

## 2012-12-19 ENCOUNTER — Ambulatory Visit (INDEPENDENT_AMBULATORY_CARE_PROVIDER_SITE_OTHER): Payer: Medicare Other | Admitting: Psychiatry

## 2012-12-19 VITALS — Ht 66.0 in | Wt 142.0 lb

## 2012-12-19 DIAGNOSIS — F909 Attention-deficit hyperactivity disorder, unspecified type: Secondary | ICD-10-CM

## 2012-12-19 DIAGNOSIS — F840 Autistic disorder: Secondary | ICD-10-CM | POA: Diagnosis not present

## 2012-12-19 DIAGNOSIS — F39 Unspecified mood [affective] disorder: Secondary | ICD-10-CM

## 2012-12-19 MED ORDER — OLANZAPINE 15 MG PO TBDP: ORAL_TABLET | ORAL | Status: DC

## 2012-12-19 MED ORDER — TRAZODONE HCL 50 MG PO TABS: 50.0000 mg | ORAL_TABLET | Freq: Every day | ORAL | Status: DC

## 2012-12-19 NOTE — Progress Notes (Signed)
Patient ID: Logan [name], male   DOB: [birth year], 27 y.o.   MRN: [id number]   [facility] Follow-up Outpatient Visit  Logan [name] 06/08/1985  Date: 12/06/2012   Subjective: This patient is a 27 year old single black male who lives with his mother and his 95 year old autistic brother in [place]. He has completed the development disability program at [employer] and has nothing to do to occupy his time. The patient just saw Dora Sims PA in our [place] clinic about 2 weeks ago but the mother wanted to bring him here to establish care.  The patient has been autistic since a young age. Apparently he developed normally originally but began regressing around age 27 and lost his speech. He was always a very strong aggressive child. He's been in special education classes all his life. Unfortunately he's never been totally potty trained and still wears a diaper because he defecates in his pants. This is made it difficult to get them in any special needs program's or to get him workers. He also has problems with focus but has had a pretty good response to Adderall. He walks around around in a posturing position with both arms flexed and pulling his fingers. He drools a lot. His younger brother recently became agitated and violent and the mother called social services to possibly get them both placed on the home. She is now reconsidering this and I suggested she look at other options such as getting more help in the home. Overall she thinks his medications are effective.  There were no vitals filed for this visit.  Mental Status Examination  Appearance: Casual Alert: Yes Attention:  poor he sits and stands at well drooling and posturing Cooperative: Yes Eye Contact: Fair Speech: Absent Psychomotor Activity: Restlessness Memory/Concentration: Undetermined Oriented: Undetermined Mood: Euthymic Affect: Congruent Thought Processes and Associations: Undetermined Fund  of Knowledge: Poor Thought Content: Undetermined Insight: Poor Judgement: Fair  Diagnosis: Autism, ADHD combined type  Treatment Plan: We will continue Adderall 20 mg daily, Zyprexa Zydis 15 mg 4 times daily, and trazodone 50 mg at bedtime. Auren will return for a followup in 3 months with Dr. Tenny Craw.  [name], MD

## 2013-02-04 DIAGNOSIS — F72 Severe intellectual disabilities: Secondary | ICD-10-CM | POA: Diagnosis not present

## 2013-02-04 DIAGNOSIS — F840 Autistic disorder: Secondary | ICD-10-CM | POA: Diagnosis not present

## 2013-02-06 DIAGNOSIS — F72 Severe intellectual disabilities: Secondary | ICD-10-CM | POA: Diagnosis not present

## 2013-02-06 DIAGNOSIS — F840 Autistic disorder: Secondary | ICD-10-CM | POA: Diagnosis not present

## 2013-03-12 ENCOUNTER — Telehealth (HOSPITAL_COMMUNITY): Payer: Self-pay | Admitting: Psychiatry

## 2013-03-13 NOTE — Telephone Encounter (Signed)
Med denied.  

## 2013-03-14 ENCOUNTER — Telehealth (HOSPITAL_COMMUNITY): Payer: Self-pay | Admitting: Psychiatry

## 2013-03-14 DIAGNOSIS — F72 Severe intellectual disabilities: Secondary | ICD-10-CM | POA: Diagnosis not present

## 2013-03-14 NOTE — Telephone Encounter (Signed)
Call [employer] for appeal, left message 2pm

## 2013-03-21 ENCOUNTER — Ambulatory Visit (INDEPENDENT_AMBULATORY_CARE_PROVIDER_SITE_OTHER): Payer: Medicare Other | Admitting: Psychiatry

## 2013-03-21 ENCOUNTER — Encounter (HOSPITAL_COMMUNITY): Payer: Self-pay | Admitting: Psychiatry

## 2013-03-21 VITALS — Ht 66.0 in | Wt 151.0 lb

## 2013-03-21 DIAGNOSIS — F840 Autistic disorder: Secondary | ICD-10-CM

## 2013-03-21 DIAGNOSIS — F909 Attention-deficit hyperactivity disorder, unspecified type: Secondary | ICD-10-CM

## 2013-03-21 DIAGNOSIS — F39 Unspecified mood [affective] disorder: Secondary | ICD-10-CM

## 2013-03-21 MED ORDER — AMPHETAMINE-DEXTROAMPHETAMINE 20 MG PO TABS: 20.0000 mg | ORAL_TABLET | Freq: Every day | ORAL | Status: DC

## 2013-03-21 MED ORDER — TRAZODONE HCL 50 MG PO TABS: 50.0000 mg | ORAL_TABLET | Freq: Every day | ORAL | Status: DC

## 2013-03-21 MED ORDER — OLANZAPINE 15 MG PO TBDP: ORAL_TABLET | ORAL | Status: DC

## 2013-03-21 NOTE — Progress Notes (Signed)
Patient ID: Ned ClinesCharlie M Lopez, male   DOB: Jul 31, 1985, 28 y.o.   MRN: [id number] Patient ID: Ned ClinesCharlie M Akram, male   DOB: Jul 31, 1985, 28 y.o.   MRN: [id number]   [facility]  Ned ClinesCharlie M Burrough Jul 31, 1985  Date: 12/06/2012   Subjective: This patient is a 28 year old single black male who lives with his mother and his 26 year old autistic brother in [place]. He has completed the development disability program at [employer] and has nothing to do to occupy his time. The patient just saw Dora Simslan Watts PA in our [place] clinic about 2 weeks ago but the mother wanted to bring him here to establish care.  The patient has been autistic since a young age. Apparently he developed normally originally but began regressing around age 89 and lost his speech. He was always a very strong aggressive child. He's been in special education classes all his life. Unfortunately he's never been totally potty trained and still wears a diaper because he defecates in his pants. This is made it difficult to get them in any special needs program's or to get him workers. He also has problems with focus but has had a pretty good response to Adderall. He walks around around in a posturing position with both arms flexed and pulling his fingers. He drools a lot. His younger brother recently became agitated and violent and the mother called social services to possibly get them both placed on the home. She is now reconsidering this and I suggested she look at other options such as getting more help in the home. Overall she thinks his medications are effective.  The patient and mom return after 3 months. He is doing better. His 26 year old brother has been placed out of the home. The brother got very violent and agitated and is now in a group home. Since he left, [name] has been more responsive to his mom and more affectionate. He is calmer needing better and has gained 10 pounds. He is  sleeping well at night.  There were no vitals filed for this visit.  Mental Status Examination  Appearance: Casual Alert: Yes Attention:  poor  Cooperative: Yes Eye Contact: Fair Speech: Absent Psychomotor Activity: Restlessness Memory/Concentration: Undetermined Oriented: Undetermined Mood: Euthymic Affect: Congruent Thought Processes and Associations: Undetermined Fund of Knowledge: Poor Thought Content: Undetermined Insight: Poor Judgement: Fair  Diagnosis: Autism, ADHD combined type  Treatment Plan: We will continue Adderall 20 mg daily, Zyprexa Zydis 15 mg 4 times daily, and trazodone 50 mg at bedtime. [name] will return for a followup in 3 months  ROSS, Gavin PoundEBORAH, MD

## 2013-03-28 ENCOUNTER — Telehealth (HOSPITAL_COMMUNITY): Payer: Self-pay | Admitting: *Deleted

## 2013-03-28 ENCOUNTER — Encounter (HOSPITAL_COMMUNITY): Payer: Self-pay | Admitting: Psychiatry

## 2013-03-28 NOTE — Telephone Encounter (Signed)
done

## 2013-06-12 DIAGNOSIS — K5900 Constipation, unspecified: Secondary | ICD-10-CM | POA: Diagnosis not present

## 2013-06-12 DIAGNOSIS — F840 Autistic disorder: Secondary | ICD-10-CM | POA: Diagnosis not present

## 2013-06-18 ENCOUNTER — Encounter (HOSPITAL_COMMUNITY): Payer: Self-pay | Admitting: Psychiatry

## 2013-06-18 ENCOUNTER — Ambulatory Visit (INDEPENDENT_AMBULATORY_CARE_PROVIDER_SITE_OTHER): Payer: Medicare Other | Admitting: Psychiatry

## 2013-06-18 VITALS — Ht 66.0 in | Wt 154.0 lb

## 2013-06-18 DIAGNOSIS — F909 Attention-deficit hyperactivity disorder, unspecified type: Secondary | ICD-10-CM

## 2013-06-18 DIAGNOSIS — F840 Autistic disorder: Secondary | ICD-10-CM

## 2013-06-18 DIAGNOSIS — F39 Unspecified mood [affective] disorder: Secondary | ICD-10-CM

## 2013-06-18 MED ORDER — OLANZAPINE 15 MG PO TBDP: ORAL_TABLET | ORAL | Status: DC

## 2013-06-18 MED ORDER — AMPHETAMINE-DEXTROAMPHETAMINE 20 MG PO TABS: 20.0000 mg | ORAL_TABLET | Freq: Every day | ORAL | Status: DC

## 2013-06-18 MED ORDER — TRAZODONE HCL 50 MG PO TABS: 50.0000 mg | ORAL_TABLET | Freq: Every day | ORAL | Status: DC

## 2013-06-18 NOTE — Progress Notes (Signed)
Patient ID: Logan [name], male   DOB: 1985-12-07, 28 y.o.   MRN: [id number] Patient ID: Logan [name], male   DOB: 1985-12-07, 28 y.o.   MRN: [id number] Patient ID: Logan [name], male   DOB: 1985-12-07, 28 y.o.   MRN: [id number]   [facility]  Logan [name] 1985-12-07  Date: 12/06/2012   Subjective: This patient is a 28 year old single black male who lives with his mother and his 26 year old autistic brother in [place]. He has completed the development disability program at [employer] and has nothing to do to occupy his time. The patient just saw Dora Simslan Watts PA in our [place] clinic about 2 weeks ago but the mother wanted to bring him here to establish care.  The patient has been autistic since a young age. Apparently he developed normally originally but began regressing around age 89 and lost his speech. He was always a very strong aggressive child. He's been in special education classes all his life. Unfortunately he's never been totally potty trained and still wears a diaper because he defecates in his pants. This is made it difficult to get them in any special needs program's or to get him workers. He also has problems with focus but has had a pretty good response to Adderall. He walks around around in a posturing position with both arms flexed and pulling his fingers. He drools a lot. His younger brother recently became agitated and violent and the mother called social services to possibly get them both placed on the home. She is now reconsidering this and I suggested she look at other options such as getting more help in the home. Overall she thinks his medications are effective.  The patient and mom return after 3 months. He has been doing exceptionally well since his brother got placed out of the home. His brother was violent and agitated. He's been calmer and has been doing more things with his mother. He is getting out in the  community more with her and he is sleeping well at night. She feels that his medications are very effective There were no vitals filed for this visit.  Mental Status Examination  Appearance: Casual Alert: Yes Attention:  poor  Cooperative: Yes Eye Contact: Fair Speech: Absent Psychomotor Activity: Restlessness Memory/Concentration: Undetermined Oriented: Undetermined Mood: Euthymic Affect: Congruent Thought Processes and Associations: Undetermined Fund of Knowledge: Poor Thought Content: Undetermined Insight: Poor Judgement: Fair  Diagnosis: Autism, ADHD combined type  Treatment Plan: We will continue Adderall 20 mg daily, Zyprexa Zydis 15 mg 4 times daily, and trazodone 50 mg at bedtime. Logan [name] will return for a followup in 3 months  ROSS, Gavin PoundEBORAH, MD

## 2013-09-13 DIAGNOSIS — F72 Severe intellectual disabilities: Secondary | ICD-10-CM | POA: Diagnosis not present

## 2013-09-18 ENCOUNTER — Encounter (HOSPITAL_COMMUNITY): Payer: Self-pay | Admitting: Psychiatry

## 2013-09-18 ENCOUNTER — Ambulatory Visit (HOSPITAL_COMMUNITY): Payer: Medicare Other | Admitting: Psychiatry

## 2013-09-18 VITALS — Ht 66.0 in | Wt 154.0 lb

## 2013-09-18 DIAGNOSIS — F909 Attention-deficit hyperactivity disorder, unspecified type: Secondary | ICD-10-CM

## 2013-09-18 DIAGNOSIS — F840 Autistic disorder: Secondary | ICD-10-CM

## 2013-09-18 DIAGNOSIS — F39 Unspecified mood [affective] disorder: Secondary | ICD-10-CM

## 2013-09-18 MED ORDER — AMPHETAMINE-DEXTROAMPHETAMINE 20 MG PO TABS: 20.0000 mg | ORAL_TABLET | Freq: Every day | ORAL | Status: DC

## 2013-09-18 MED ORDER — TRAZODONE HCL 50 MG PO TABS: 50.0000 mg | ORAL_TABLET | Freq: Every day | ORAL | Status: DC

## 2013-09-18 MED ORDER — OLANZAPINE 15 MG PO TBDP: ORAL_TABLET | ORAL | Status: DC

## 2013-09-18 NOTE — Progress Notes (Unsigned)
Patient ID: Ned ClinesCharlie M Crear, male   DOB: 1985-06-04, 28 y.o.   MRN: [id number] Patient ID: Ned ClinesCharlie M Copelin, male   DOB: 1985-06-04, 28 y.o.   MRN: [id number] Patient ID: Ned ClinesCharlie M Flatt, male   DOB: 1985-06-04, 28 y.o.   MRN: [id number] Patient ID: Ned ClinesCharlie M Lafon, male   DOB: 1985-06-04, 28 y.o.   MRN: [id number]   [facility]  Ned ClinesCharlie M Metzer 1985-06-04  Date: 12/06/2012   Subjective: This patient is a 28 year old single black male who lives with his mother  in [place]. He has completed the development disability program at [employer] and has nothing to do to occupy his time. The patient just saw Dora Simslan Watts PA in our [place] clinic about 2 weeks ago but the mother wanted to bring him here to establish care.  The patient has been autistic since a young age. Apparently he developed normally originally but began regressing around age 89 and lost his speech. He was always a very strong aggressive child. He's been in special education classes all his life. Unfortunately he's never been totally potty trained and still wears a diaper because he defecates in his pants. This is made it difficult to get them in any special needs program's or to get him workers. He also has problems with focus but has had a pretty good response to Adderall. He walks around around in a posturing position with both arms flexed and pulling his fingers. He drools a lot. His younger brother recently became agitated and violent and the mother called social services to possibly get them both placed on the home. She is now reconsidering this and I suggested she look at other options such as getting more help in the home. Overall she thinks his medications are effective.  The patient and mom return after 3 months. He has been doing exceptionally well since his brother got placed out of the home. His brother was violent and agitated. The patient occasionally gets a bit agitated  himself but not to the point of violence. His mother states that his medications work very well for him. He's going to possibly be able to get some community-based services but his mother is not sure she wants to utilize them. She doesn't know how he will react to new people. He is sleeping and eating well There were no vitals filed for this visit.  Mental Status Examination  Appearance: Casual Alert: Yes Attention:  poor  Cooperative: Yes Eye Contact: Fair Speech: Absent Psychomotor Activity: Restlessness Memory/Concentration: Undetermined Oriented: Undetermined Mood: Euthymic Affect: Congruent Thought Processes and Associations: Undetermined Fund of Knowledge: Poor Thought Content: Undetermined Insight: Poor Judgement: Fair  Diagnosis: Autism, ADHD combined type  Treatment Plan: We will continue Adderall 20 mg daily, Zyprexa Zydis 15 mg 4 times daily, and trazodone 50 mg at bedtime. He will get labs done before his next visit at his primary doctor's office including a basic metabolic panel and lipid panel [name] will return for a followup in 3 months  ROSS, Gavin PoundEBORAH, MD

## 2013-12-19 ENCOUNTER — Ambulatory Visit (INDEPENDENT_AMBULATORY_CARE_PROVIDER_SITE_OTHER): Payer: Medicare Other | Admitting: Psychiatry

## 2013-12-19 ENCOUNTER — Encounter (HOSPITAL_COMMUNITY): Payer: Self-pay | Admitting: Psychiatry

## 2013-12-19 VITALS — BP 136/90 | HR 107 | Ht 66.0 in | Wt 154.6 lb

## 2013-12-19 DIAGNOSIS — F901 Attention-deficit hyperactivity disorder, predominantly hyperactive type: Secondary | ICD-10-CM

## 2013-12-19 DIAGNOSIS — F902 Attention-deficit hyperactivity disorder, combined type: Secondary | ICD-10-CM

## 2013-12-19 DIAGNOSIS — F840 Autistic disorder: Secondary | ICD-10-CM | POA: Diagnosis not present

## 2013-12-19 DIAGNOSIS — F39 Unspecified mood [affective] disorder: Secondary | ICD-10-CM

## 2013-12-19 MED ORDER — AMPHETAMINE-DEXTROAMPHETAMINE 20 MG PO TABS: 20.0000 mg | ORAL_TABLET | Freq: Every day | ORAL | Status: DC

## 2013-12-19 MED ORDER — TRAZODONE HCL 50 MG PO TABS: 50.0000 mg | ORAL_TABLET | Freq: Every day | ORAL | Status: DC

## 2013-12-19 MED ORDER — OLANZAPINE 15 MG PO TBDP: ORAL_TABLET | ORAL | Status: DC

## 2013-12-19 NOTE — Progress Notes (Signed)
Patient ID: Logan [name], male   DOB: [birth year], 28 y.o.   MRN: [id number] Patient ID: Logan ClinesCharlie M [name], male   DOB: [birth year], 28 y.o.   MRN: [id number] Patient ID: Logan ClinesCharlie M [name], male   DOB: [birth year], 28 y.o.   MRN: [id number] Patient ID: Logan ClinesCharlie M [name], male   DOB: [birth year], 28 y.o.   MRN: [id number] Patient ID: Logan ClinesCharlie M [name], male   DOB: [birth year], 28 y.o.   MRN: [id number]   [facility]  Logan ClinesCharlie M [name] [birth year]  Date: 12/06/2012   Subjective: This patient is a 28 year old single black male who lives with his mother  in [place]. He has completed the development disability program at [employer] and has nothing to do to occupy his time. The patient just saw Dora Simslan Watts PA in our [place] clinic about 2 weeks ago but the mother wanted to bring him here to establish care.  The patient has been autistic since a young age. Apparently he developed normally originally but began regressing around age 89 and lost his speech. He was always a very strong aggressive child. He's been in special education classes all his life. Unfortunately he's never been totally potty trained and still wears a diaper because he defecates in his pants. This is made it difficult to get them in any special needs program's or to get him workers. He also has problems with focus but has had a pretty good response to Adderall. He walks around around in a posturing position with both arms flexed and pulling his fingers. He drools a lot. His younger brother recently became agitated and violent and the mother called social services to possibly get them both placed on the home. She is now reconsidering this and I suggested she look at other options such as getting more help in the home. Overall she thinks his medications are effective.  The patient and mom return after 3 months. He continues to do well. He is sleeping well at night and has not been violent or agitated.  Sometimes he doesn't want to listen. He supposed to go to his family doctor next week and this will include his blood work. He's not yet involved in any community programs. His blood pressure is a bit high today but this will be rechecked at his primary care physician's office Filed Vitals:   12/19/13 1033  BP: 136/90  Pulse: 107    Mental Status Examination  Appearance: Casual Alert: Yes Attention:  poor  Cooperative: Yes Eye Contact absent Speech: Absent Psychomotor Activity: Restlessness Memory/Concentration: Undetermined Oriented: Undetermined Mood: Euthymic Affect: Congruent Thought Processes and Associations: Undetermined Fund of Knowledge: Poor Thought Content: Undetermined Insight: Poor Judgement: Fair  Diagnosis: Autism, ADHD combined type  Treatment Plan: We will continue Adderall 20 mg daily, Zyprexa Zydis 15 mg 4 times daily, and trazodone 50 mg at bedtime. He will get labs done before his next visit at his primary doctor's office including a basic metabolic panel and lipid panel [name] will return for a followup in 3 months  Evin Chirco, Gavin PoundEBORAH, MD

## 2013-12-25 DIAGNOSIS — K5900 Constipation, unspecified: Secondary | ICD-10-CM | POA: Diagnosis not present

## 2013-12-25 DIAGNOSIS — F72 Severe intellectual disabilities: Secondary | ICD-10-CM | POA: Diagnosis not present

## 2014-03-20 ENCOUNTER — Other Ambulatory Visit (HOSPITAL_COMMUNITY): Payer: Self-pay | Admitting: Psychiatry

## 2014-03-21 ENCOUNTER — Ambulatory Visit (INDEPENDENT_AMBULATORY_CARE_PROVIDER_SITE_OTHER): Payer: Medicare Other | Admitting: Psychiatry

## 2014-03-21 ENCOUNTER — Encounter (HOSPITAL_COMMUNITY): Payer: Self-pay | Admitting: Psychiatry

## 2014-03-21 VITALS — BP 128/90 | HR 101 | Ht 66.0 in | Wt 156.8 lb

## 2014-03-21 DIAGNOSIS — F902 Attention-deficit hyperactivity disorder, combined type: Secondary | ICD-10-CM | POA: Diagnosis not present

## 2014-03-21 DIAGNOSIS — F39 Unspecified mood [affective] disorder: Secondary | ICD-10-CM

## 2014-03-21 DIAGNOSIS — F840 Autistic disorder: Secondary | ICD-10-CM | POA: Diagnosis not present

## 2014-03-21 MED ORDER — DIAZEPAM 5 MG PO TABS: ORAL_TABLET | ORAL | Status: DC

## 2014-03-21 MED ORDER — AMPHETAMINE-DEXTROAMPHETAMINE 20 MG PO TABS: 20.0000 mg | ORAL_TABLET | Freq: Every day | ORAL | Status: DC

## 2014-03-21 MED ORDER — TRAZODONE HCL 50 MG PO TABS: 50.0000 mg | ORAL_TABLET | Freq: Every day | ORAL | Status: DC

## 2014-03-21 MED ORDER — OLANZAPINE 15 MG PO TBDP: ORAL_TABLET | ORAL | Status: DC

## 2014-03-21 NOTE — Progress Notes (Signed)
Patient ID: Logan [name] Spickard, male   DOB: [birth year], 29 y.o.   MRN: [id number] Patient ID: Rogen Porte Barcomb, male   DOB: 1985/08/08, 29 y.o.   MRN: [id number] Patient ID: Wes Lezotte Lina, male   DOB: 1986/01/24, 29 y.o.   MRN: [id number] Patient ID: Geza Beranek Berch, male   DOB: 08/05/1985, 29 y.o.   MRN: [id number] Patient ID: Kendrell Lottman Sear, male   DOB: 1985/05/12, 29 y.o.   MRN: [id number] Patient ID: Lucah Petta Curl, male   DOB: [birth year], 29 y.o.   MRN: [id number]   [facility] Follow-up Outpatient Visit  Larnie Heart John 12/29/1985  Date: 12/06/2012   Subjective: This patient is a 29 year old single black male who lives with his mother  in [place]. He has completed the development disability program at [employer] and has nothing to do to occupy his time. The patient just saw Dora Sims PA in our [place] clinic about 2 weeks ago but the mother wanted to bring him here to establish care.  The patient has been autistic since a young age. Apparently he developed normally originally but began regressing around age 55 and lost his speech. He was always a very strong aggressive child. He's been in special education classes all his life. Unfortunately he's never been totally potty trained and still wears a diaper because he defecates in his pants. This is made it difficult to get them in any special needs program's or to get him workers. He also has problems with focus but has had a pretty good response to Adderall. He walks around around in a posturing position with both arms flexed and pulling his fingers. He drools a lot. His younger brother recently became agitated and violent and the mother called social services to possibly get them both placed on the home. She is now reconsidering this and I suggested she look at other options such as getting more help in the home. Overall she thinks his medications are effective.  The patient and mom return after 3 months. He continues  to do well. He is sleeping well at night and has not been violent or agitated.  His mother is considering putting him in a work program that his brother attends. He was not able to get any lab work yet because the primary physician wanted mother to bring "A man to hold him down" apparently he gets very agitated during blood draws. I suggested we give him a low dose of Valium before his blood drawn stat and his mother is agreeable. The mother states that he is not needing quite as much Zyprexa and most days he only takes 3 a day Filed Vitals:   03/21/14 0958  BP: 128/90  Pulse: 101    Mental Status Examination  Appearance: Casual drooling, gesturing Alert: Yes Attention:  poor  Cooperative: Yes Eye Contact absent Speech: Absent Psychomotor Activity: Restlessness Memory/Concentration: Undetermined Oriented: Undetermined Mood: Euthymic Affect: Congruent Thought Processes and Associations: Undetermined Fund of Knowledge: Poor Thought Content: Undetermined Insight: Poor Judgement: Fair  Diagnosis: Autism, ADHD combined type  Treatment Plan: We will continue Adderall 20 mg daily, Zyprexa Zydis 15 mg 4 times daily, and trazodone 50 mg at bedtime. He will get labs done before his next visit at his primary doctor's office including a basic metabolic panel and lipid pane.I will give him Valium 5 mg and he can take up to 2 of these prior to his blood draw l Nicanor will return for a followup in 3  months  Diannia RuderOSS, DEBORAH, MD

## 2014-04-21 DIAGNOSIS — F72 Severe intellectual disabilities: Secondary | ICD-10-CM | POA: Diagnosis not present

## 2014-06-04 ENCOUNTER — Emergency Department (HOSPITAL_COMMUNITY)
Admission: EM | Admit: 2014-06-04 | Discharge: 2014-06-04 | Disposition: A | Discharge status: Home / Self Care | Payer: Medicare Other | Attending: Emergency Medicine | Admitting: Emergency Medicine

## 2014-06-04 ENCOUNTER — Encounter (HOSPITAL_COMMUNITY): Payer: Self-pay

## 2014-06-04 ENCOUNTER — Emergency Department (HOSPITAL_COMMUNITY): Payer: Medicare Other

## 2014-06-04 DIAGNOSIS — K089 Disorder of teeth and supporting structures, unspecified: Secondary | ICD-10-CM | POA: Diagnosis not present

## 2014-06-04 DIAGNOSIS — Z792 Long term (current) use of antibiotics: Secondary | ICD-10-CM | POA: Diagnosis not present

## 2014-06-04 DIAGNOSIS — K029 Dental caries, unspecified: Secondary | ICD-10-CM | POA: Insufficient documentation

## 2014-06-04 DIAGNOSIS — S025XXA Fracture of tooth (traumatic), initial encounter for closed fracture: Secondary | ICD-10-CM | POA: Diagnosis not present

## 2014-06-04 DIAGNOSIS — T8189XA Other complications of procedures, not elsewhere classified, initial encounter: Secondary | ICD-10-CM | POA: Diagnosis not present

## 2014-06-04 DIAGNOSIS — K088 Other specified disorders of teeth and supporting structures: Secondary | ICD-10-CM | POA: Diagnosis not present

## 2014-06-04 DIAGNOSIS — R220 Localized swelling, mass and lump, head: Secondary | ICD-10-CM | POA: Diagnosis not present

## 2014-06-04 DIAGNOSIS — R229 Localized swelling, mass and lump, unspecified: Secondary | ICD-10-CM | POA: Diagnosis not present

## 2014-06-04 DIAGNOSIS — Z79899 Other long term (current) drug therapy: Secondary | ICD-10-CM | POA: Diagnosis not present

## 2014-06-04 DIAGNOSIS — F840 Autistic disorder: Secondary | ICD-10-CM | POA: Insufficient documentation

## 2014-06-04 DIAGNOSIS — K0381 Cracked tooth: Secondary | ICD-10-CM | POA: Insufficient documentation

## 2014-06-04 MED ORDER — AMOXICILLIN 500 MG PO CAPS: 500.0000 mg | ORAL_CAPSULE | Freq: Three times a day (TID) | ORAL | Status: DC

## 2014-06-04 NOTE — ED Provider Notes (Signed)
CSN: [id number]     Arrival date & time 06/04/14  [id number] History   First MD Initiated Contact with Patient 06/04/14 0800     Chief Complaint  Patient presents with  . Dental Pain     (Consider location/radiation/quality/duration/timing/severity/associated sxs/prior Treatment) Patient is a 29 y.o. male presenting with tooth pain. The history is provided by a parent. The history is limited by a developmental delay (patient is Autistic).  Dental Pain Location:  Upper Upper teeth location:  8/RU central incisor Quality:  Unable to specify Severity:  Unable to specify Onset quality:  Gradual Duration:  1 month Progression:  Worsening Context: abscess   Relieved by:  Nothing  Logan [name] is a 29 y.o. male with Autism presents to the ED with his mother for facial swelling and broken tooth. Patient's mother states that the patient was chewing on a spoon and broke his right upper front tooth about a month ago. She took him to a dentist in [place] and he wanted the patient to go [facility] so they could sedate him to do the work in his mouth. The appointment in [place] is not until May 19. Over the past 2 days there has been facial swelling on the right and swelling and redness of the gum on the upper right. No fever.   Past Medical History  Diagnosis Date  . Autism    History reviewed. No pertinent past surgical history. Family History  Problem Relation Age of Onset  . Autism Brother    History  Substance Use Topics  . Smoking status: Never Smoker   . Smokeless tobacco: Not on file  . Alcohol Use: No    Review of Systems Negative except as stated in HPI   Allergies  Review of patient's allergies indicates no known allergies.  Home Medications   Prior to Admission medications   Medication Sig Start Date End Date Taking? Authorizing Provider  diphenhydrAMINE (BENADRYL) 25 mg capsule Take 25 mg by mouth every 6 (six) hours as needed.   Yes Historical Provider, MD    ibuprofen (ADVIL,MOTRIN) 200 MG tablet Take 200 mg by mouth every 6 (six) hours as needed.   Yes Historical Provider, MD  olanzapine zydis (ZYPREXA) 15 MG disintegrating tablet DISSOLVE 1 TABLET IN THE MOUTH 4 TIMES DAILY AS NEEDED. 03/21/14  Yes Myrlene Brokereborah R Ross, MD  polyethylene glycol (MIRALAX / GLYCOLAX) packet Take 17 g by mouth daily as needed for mild constipation.   Yes Historical Provider, MD  traZODone (DESYREL) 50 MG tablet Take 1 tablet (50 mg total) by mouth at bedtime. 03/21/14 06/21/14 Yes Myrlene Brokereborah R Ross, MD  amoxicillin (AMOXIL) 500 MG capsule Take 1 capsule (500 mg total) by mouth 3 (three) times daily. 06/04/14   Mayanna Garlitz Orlene OchM Thayden Lemire, NP  amphetamine-dextroamphetamine (ADDERALL) 20 MG tablet Take 1 tablet (20 mg total) by mouth daily. Patient not taking: Reported on 06/04/2014 03/21/14 06/21/14  Myrlene Brokereborah R Ross, MD  amphetamine-dextroamphetamine (ADDERALL) 20 MG tablet Take 1 tablet (20 mg total) by mouth daily. Patient not taking: Reported on 06/04/2014 03/21/14 04/20/14  Myrlene Brokereborah R Ross, MD  amphetamine-dextroamphetamine (ADDERALL) 20 MG tablet Take 1 tablet (20 mg total) by mouth daily. 03/21/14 04/20/14  Myrlene Brokereborah R Ross, MD  diazepam (VALIUM) 5 MG tablet Take one or two tablets prior to blood work Patient not taking: Reported on 06/04/2014 03/21/14   Myrlene Brokereborah R Ross, MD   BP 138/91 mmHg  Pulse 98  Resp 20  Ht 5\' 6"  (1.676  m)  Wt 156 lb (70.761 kg)  BMI 25.19 kg/m2  SpO2 100% Physical Exam  Constitutional: He is oriented to person, place, and time. He appears well-developed and well-nourished. No distress.  HENT:  Mouth/Throat:    Facial swelling right with right upper lip swelling.  Dental fracture upper right central incisor. Erythema and swelling of the gum upper right.   Neck: Neck supple.  Pulmonary/Chest: Effort normal.  Abdominal: Soft. There is no tenderness.  Musculoskeletal: Normal range of motion.  Neurological: He is alert and oriented to person, place, and time. No cranial nerve  deficit.  Skin: Skin is warm and dry.  Nursing note and vitals reviewed.   ED Course  Procedures (including critical care time) Labs Review Labs Reviewed - No data to display  Imaging Review Ct Maxillofacial Wo Cm  06/04/2014   CLINICAL DATA:  Right side dental pain and facial swelling.  EXAM: CT MAXILLOFACIAL WITHOUT CONTRAST  TECHNIQUE: Multidetector CT imaging of the maxillofacial structures was performed. Multiplanar CT image reconstructions were also generated. A small metallic BB was placed on the right temple in order to reliably differentiate right from left.  COMPARISON:  None.  FINDINGS: There are cavities in tooth 8 and tooth 17. There is no evidence of periapical abscess. No appreciable soft tissue swelling adjacent to the mandible or maxilla. The parotid glands and submandibular glands are normal. Tonsils are normal.  There is a benign retention cyst in the base of the left maxillary sinus. Minimal mucosal thickening on the base of the right maxillary sinus. The paranasal sinuses are otherwise clear. Mastoid air cells are clear. The visualized portion of the brain is normal.  Prevertebral soft tissues are normal.  IMPRESSION: No evidence of soft tissue abscess or periapical abscess. Cavities as noted.   Electronically Signed   By: Francene Boyers M.D.   On: 06/04/2014 09:24     MDM  29 y.o. male with broken tooth and facial swelling stable for d/c without abscess noted on CT scan. Will treat with Amoxicillin and he will follow up with the oral surgeon as planned. Discussed with the patient's mother clinical and CT findings and plan of care. All questioned fully answered. He will return if any problems arise.   Final diagnoses:  Pain due to dental caries  Broken tooth, closed, initial encounter      [facility], NP 06/04/14 1017  Benjiman Core, MD 06/04/14 [phone]

## 2014-06-04 NOTE — ED Notes (Signed)
Pt is back from CT, mom requesting water for pt but informed waiting on results to come back

## 2014-06-04 NOTE — ED Notes (Signed)
Pt's mother reports pt has dental pain with facial swelling.

## 2014-06-04 NOTE — Discharge Instructions (Signed)
Follow up with the oral surgeon as scheduled or sooner if possible. Return as needed.

## 2014-06-15 ENCOUNTER — Other Ambulatory Visit (HOSPITAL_COMMUNITY): Payer: Self-pay | Admitting: Psychiatry

## 2014-06-17 ENCOUNTER — Ambulatory Visit (INDEPENDENT_AMBULATORY_CARE_PROVIDER_SITE_OTHER): Payer: Medicare Other | Admitting: Psychiatry

## 2014-06-17 ENCOUNTER — Encounter (HOSPITAL_COMMUNITY): Payer: Self-pay | Admitting: Psychiatry

## 2014-06-17 VITALS — BP 134/94 | HR 95 | Ht 66.0 in | Wt 163.0 lb

## 2014-06-17 DIAGNOSIS — F39 Unspecified mood [affective] disorder: Secondary | ICD-10-CM

## 2014-06-17 DIAGNOSIS — F902 Attention-deficit hyperactivity disorder, combined type: Secondary | ICD-10-CM | POA: Diagnosis not present

## 2014-06-17 DIAGNOSIS — F840 Autistic disorder: Secondary | ICD-10-CM | POA: Diagnosis not present

## 2014-06-17 MED ORDER — OLANZAPINE 15 MG PO TBDP: ORAL_TABLET | ORAL | Status: DC

## 2014-06-17 MED ORDER — AMPHETAMINE-DEXTROAMPHETAMINE 20 MG PO TABS: 20.0000 mg | ORAL_TABLET | Freq: Every day | ORAL | Status: DC

## 2014-06-17 NOTE — Progress Notes (Signed)
Patient ID: Logan [name], male   DOB: 25-Jun-1985, 29 y.o.   MRN: [id number] Patient ID: Logan [name], male   DOB: 25-Jun-1985, 29 y.o.   MRN: [id number] Patient ID: Logan [name], male   DOB: 25-Jun-1985, 29 y.o.   MRN: [id number] Patient ID: Logan [name], male   DOB: 25-Jun-1985, 29 y.o.   MRN: [id number] Patient ID: Logan [name], male   DOB: 25-Jun-1985, 29 y.o.   MRN: [id number] Patient ID: Logan [name], male   DOB: 25-Jun-1985, 29 y.o.   MRN: [id number] Patient ID: Logan [name], male   DOB: 25-Jun-1985, 29 y.o.   MRN: [id number]   [facility]  Logan [name] 25-Jun-1985  Date: 12/06/2012   Subjective: This patient is a 29 year old single black male who lives with his mother  in [place]. He has completed the development disability program at [employer] and has nothing to do to occupy his time. The patient just saw Dora Simslan Watts PA in our [place] [facility] about 2 weeks ago but the mother wanted to bring him here to establish care.  The patient has been autistic since a young age. Apparently he developed normally originally but began regressing around age 89 and lost his speech. He was always a very strong aggressive child. He's been in special education classes all his life. Unfortunately he's never been totally potty trained and still wears a diaper because he defecates in his pants. This is made it difficult to get them in any special needs program's or to get him workers. He also has problems with focus but has had a pretty good response to Adderall. He walks around around in a posturing position with both arms flexed and pulling his fingers. He drools a lot. His younger brother recently became agitated and violent and the mother called social services to possibly get them both placed on the home. She is now reconsidering this and I suggested she look at other options such as getting more help in the home. Overall she thinks his  medications are effective.  The patient and mom return after 3 months. He apparently broke one of his teeth after chewing on a metal spoon. His mother states that he chews on things constantly and she's given him a stuffed animal to chew on. [facility] is [facility]. At that time they can also draw his labs while he is under anesthesia. Gen. he is been well behaved and less agitated. He often sleeps well without the use of trazodone. He still drools continuously and has no speech. His mother thinks that his medicines are been controlling his agitation very well Filed Vitals:   06/17/14 1109  BP: 134/94  Pulse: 95    Mental Status Examination  Appearance: Casual drooling, gesturing Alert: Yes Attention:  poor  Cooperative: Yes Eye Contact absent Speech: Absent Psychomotor Activity: Restlessness Memory/Concentration: Undetermined Oriented: Undetermined Mood: Euthymic Affect: Congruent Thought Processes and Associations: Undetermined Fund of Knowledge: Poor Thought Content: Undetermined Insight: Poor Judgement: Fair  Diagnosis: Autism, ADHD combined type  Treatment Plan: We will continue Adderall 20 mg daily, Zyprexa Zydis 15 mg 4 times daily, and trazodone 50 mg at bedtime. He will get up [place] [facility]. Logan [name] will return for a followup in 3 months  [name], Gavin PoundEBORAH, MD

## 2014-06-19 ENCOUNTER — Ambulatory Visit (HOSPITAL_COMMUNITY): Payer: Self-pay | Admitting: Psychiatry

## 2014-06-23 NOTE — Telephone Encounter (Signed)
Pt pharmacy requesting refills for pt Trazodone. Pt script was filled 03-21-14 with 30 tablets with 2 refills. Pt was last in for f/u 06-17-14. Pt pharmacy number is [phone].

## 2014-06-24 NOTE — Telephone Encounter (Signed)
noted 

## 2014-07-21 DIAGNOSIS — F72 Severe intellectual disabilities: Secondary | ICD-10-CM | POA: Diagnosis not present

## 2014-07-22 DIAGNOSIS — F79 Unspecified intellectual disabilities: Secondary | ICD-10-CM | POA: Diagnosis not present

## 2014-07-22 DIAGNOSIS — F840 Autistic disorder: Secondary | ICD-10-CM | POA: Diagnosis not present

## 2014-07-22 DIAGNOSIS — Z01818 Encounter for other preprocedural examination: Secondary | ICD-10-CM | POA: Diagnosis not present

## 2014-08-12 ENCOUNTER — Telehealth (HOSPITAL_COMMUNITY): Payer: Self-pay | Admitting: *Deleted

## 2014-08-12 DIAGNOSIS — F840 Autistic disorder: Secondary | ICD-10-CM | POA: Diagnosis not present

## 2014-08-12 DIAGNOSIS — F79 Unspecified intellectual disabilities: Secondary | ICD-10-CM | POA: Diagnosis not present

## 2014-08-12 DIAGNOSIS — K029 Dental caries, unspecified: Secondary | ICD-10-CM | POA: Diagnosis not present

## 2014-08-28 ENCOUNTER — Ambulatory Visit (INDEPENDENT_AMBULATORY_CARE_PROVIDER_SITE_OTHER): Payer: Medicare Other | Admitting: Psychiatry

## 2014-08-28 ENCOUNTER — Encounter (HOSPITAL_COMMUNITY): Payer: Self-pay | Admitting: Psychiatry

## 2014-08-28 VITALS — BP 137/90 | HR 96 | Ht 66.77 in | Wt 162.4 lb

## 2014-08-28 DIAGNOSIS — F39 Unspecified mood [affective] disorder: Secondary | ICD-10-CM

## 2014-08-28 DIAGNOSIS — F840 Autistic disorder: Secondary | ICD-10-CM

## 2014-08-28 DIAGNOSIS — F902 Attention-deficit hyperactivity disorder, combined type: Secondary | ICD-10-CM | POA: Diagnosis not present

## 2014-08-28 MED ORDER — TRAZODONE HCL 50 MG PO TABS
50.0000 mg | ORAL_TABLET | Freq: Every day | ORAL | Status: DC
Start: 2014-08-28 — End: 2014-11-28

## 2014-08-28 MED ORDER — AMPHETAMINE-DEXTROAMPHETAMINE 20 MG PO TABS: 20.0000 mg | ORAL_TABLET | Freq: Every day | ORAL | Status: DC

## 2014-08-28 MED ORDER — OLANZAPINE 15 MG PO TBDP: ORAL_TABLET | ORAL | Status: DC

## 2014-08-28 NOTE — Progress Notes (Signed)
Patient ID: Ned ClinesCharlie M Ungaro, male   DOB: 16-Aug-1985, 29 y.o.   MRN: [id number] Patient ID: Ned ClinesCharlie M Winker, male   DOB: 16-Aug-1985, 29 y.o.   MRN: [id number] Patient ID: Ned ClinesCharlie M Rakes, male   DOB: 16-Aug-1985, 29 y.o.   MRN: [id number] Patient ID: Ned ClinesCharlie M Zech, male   DOB: 16-Aug-1985, 29 y.o.   MRN: [id number] Patient ID: Ned ClinesCharlie M Connon, male   DOB: 16-Aug-1985, 29 y.o.   MRN: [id number] Patient ID: Ned ClinesCharlie M Rickles, male   DOB: 16-Aug-1985, 29 y.o.   MRN: [id number] Patient ID: Ned ClinesCharlie M Bhakta, male   DOB: 16-Aug-1985, 29 y.o.   MRN: [id number] Patient ID: Ned ClinesCharlie M Hoyos, male   DOB: 16-Aug-1985, 29 y.o.   MRN: [id number]   [facility] Follow[facility]  Ned ClinesCharlie M Bernet 16-Aug-1985  Date: 12/06/2012   Subjective: This patient is a 29 year old single black male who lives [place]. He has completed the development disability program at [employer] and has nothing to do to occupy his time. The patient just saw Dora Simslan Watts PA in our [place] clinic about 2 weeks ago but the mother wanted to bring him here to establish care.  The patient has been autistic since a young age. Apparently he developed normally originally but began regressing around age 89 and lost his speech. He was always a very strong aggressive child. He's been in special education classes all his life. Unfortunately he's never been totally potty trained and still wears a diaper because he defecates in his pants. This is made it difficult to get them in any special needs program's or to get him workers. He also has problems with focus but has had a pretty good response to Adderall. He walks around around in a posturing position with both arms flexed and pulling his fingers. He drools a lot. His younger brother recently became agitated and violent and the mother called social services to possibly get them both placed on the home. She is now reconsidering this and I suggested she look at  other options such as getting more help in the home. Overall she thinks his medications are effective.  The patient and mom return after 3 months. He recently [facility] and he did well with the anesthesia. He did just fine when he came home and stayed on a liquid diet for a little bit and now is eating regular food. He has been stable and is no longer acting out or agitated. He is sleeping well at night Filed Vitals:   08/28/14 1445  BP: 137/90  Pulse: 96    Mental Status Examination  Appearance: Casual drooling, gesturing Alert: Yes Attention:  poor  Cooperative: Yes Eye Contact absent Speech: Absent Psychomotor Activity: quiet today Memory/Concentration: Undetermined Oriented: Undetermined Mood: Euthymic Affect: Congruent Thought Processes and Associations: Undetermined Fund of Knowledge: Poor Thought Content: Undetermined Insight: Poor Judgement: Fair  Diagnosis: Autism, ADHD combined type  Treatment Plan: We will continue Adderall 20 mg daily ADHD, Zyprexa Zydis 15 mg 4 times daily for mood stabilization, and trazodone 50 mg at bedtime for sleep.[name] will return for a followup in 3 months  ROSS, Gavin PoundEBORAH, MD

## 2014-09-17 ENCOUNTER — Ambulatory Visit (HOSPITAL_COMMUNITY): Payer: Self-pay | Admitting: Psychiatry

## 2014-09-22 ENCOUNTER — Telehealth (HOSPITAL_COMMUNITY): Payer: Self-pay | Admitting: *Deleted

## 2014-09-22 ENCOUNTER — Other Ambulatory Visit (HOSPITAL_COMMUNITY): Payer: Self-pay | Admitting: Psychiatry

## 2014-09-22 MED ORDER — OLANZAPINE 15 MG PO TBDP: ORAL_TABLET | ORAL | Status: DC

## 2014-09-22 NOTE — Telephone Encounter (Signed)
Was sent on 7/14 but i resent it today

## 2014-09-22 NOTE — Telephone Encounter (Signed)
noted 

## 2014-09-22 NOTE — Telephone Encounter (Signed)
Phone call from patient's mom, he need refill of Olenzapine.   She use [facility].

## 2014-10-21 DIAGNOSIS — F79 Unspecified intellectual disabilities: Secondary | ICD-10-CM | POA: Diagnosis not present

## 2014-11-05 ENCOUNTER — Telehealth (HOSPITAL_COMMUNITY): Payer: Self-pay | Admitting: *Deleted

## 2014-11-05 NOTE — Telephone Encounter (Signed)
phone call from mom patient need refill for Trazodone at [facility].

## 2014-11-07 NOTE — Telephone Encounter (Signed)
Called pt mother and informed her that pt medication should run out on 11-28-14 due to when Dr. Tenny Craw last filled it. Pt mother should understanding

## 2014-11-26 ENCOUNTER — Other Ambulatory Visit (HOSPITAL_COMMUNITY): Payer: Self-pay | Admitting: Psychiatry

## 2014-11-28 ENCOUNTER — Encounter (HOSPITAL_COMMUNITY): Payer: Self-pay | Admitting: Psychiatry

## 2014-11-28 ENCOUNTER — Ambulatory Visit (INDEPENDENT_AMBULATORY_CARE_PROVIDER_SITE_OTHER): Payer: Medicare Other | Admitting: Psychiatry

## 2014-11-28 VITALS — BP 130/85 | HR 103 | Ht 66.77 in | Wt 163.0 lb

## 2014-11-28 DIAGNOSIS — F39 Unspecified mood [affective] disorder: Secondary | ICD-10-CM

## 2014-11-28 DIAGNOSIS — F902 Attention-deficit hyperactivity disorder, combined type: Secondary | ICD-10-CM | POA: Diagnosis not present

## 2014-11-28 MED ORDER — AMPHETAMINE-DEXTROAMPHETAMINE 20 MG PO TABS: 20.0000 mg | ORAL_TABLET | Freq: Every day | ORAL | Status: DC

## 2014-11-28 MED ORDER — TRAZODONE HCL 50 MG PO TABS
50.0000 mg | ORAL_TABLET | Freq: Every day | ORAL | Status: DC
Start: 2014-11-28 — End: 2015-02-19

## 2014-11-28 MED ORDER — OLANZAPINE 15 MG PO TBDP: ORAL_TABLET | ORAL | Status: DC

## 2014-11-28 NOTE — Progress Notes (Signed)
Patient ID: Logan [name], male   DOB: [birth year], 29 y.o.   MRN: [id number] Patient ID: Logan [name], male   DOB: [birth year], 29 y.o.   MRN: [id number] Patient ID: Logan [name], male   DOB: [birth year], 29 y.o.   MRN: [id number] Patient ID: Logan [name], male   DOB: [birth year], 29 y.o.   MRN: [id number] Patient ID: Logan [name], male   DOB: [birth year], 29 y.o.   MRN: [id number] Patient ID: Logan [name], male   DOB: [birth year], 29 y.o.   MRN: [id number] Patient ID: Logan [name], male   DOB: [birth year], 29 y.o.   MRN: [id number] Patient ID: Logan [name], male   DOB: [birth year], 29 y.o.   MRN: [id number] Patient ID: Logan [name], male   DOB: [birth year], 29 y.o.   MRN: [id number]   [facility]  Logan [name] [birth year]  Date: 12/06/2012   Subjective: This patient is a 29 year old single black male who lives with [place]. He has completed the development disability program at [employer] and has nothing to do to occupy his time. The patient just saw Dora Simslan Watts PA in our [place] clinic about 2 weeks ago but the mother wanted to bring him here to establish care.  The patient has been autistic since a young age. Apparently he developed normally originally but began regressing around age 89 and lost his speech. He was always a very strong aggressive child. He's been in special education classes all his life. Unfortunately he's never been totally potty trained and still wears a diaper because he defecates in his pants. This is made it difficult to get them in any special needs program's or to get him workers. He also has problems with focus but has had a pretty good response to Adderall. He walks around around in a posturing position with both arms flexed and pulling his fingers. He drools a lot. His younger brother recently became agitated and violent and the mother called social services to possibly get them both  placed on the home. She is now reconsidering this and I suggested she look at other options such as getting more help in the home. Overall she thinks his medications are effective.  The patient and mom return after 3 months. He he is generally doing well. He has not been agitated or violent. His mother thinks he is ready to try day treatment program and she's trying to arrange this through [employer]. Since his younger brother was put in a group home he is been much calmer. He sleeps well at night and the mother states he's actually more verbal and is trying to talk to her occasionally and he's also come out of the house onto the porch Filed Vitals:   11/28/14 1027  BP: 130/85  Pulse: 103    Mental Status Examination  Appearance: Casual drooling, gesturing Alert: Yes Attention:  poor  Cooperative: Yes Eye Contact absent Speech: Absent Psychomotor Activity: quiet today Memory/Concentration: Undetermined Oriented: Undetermined Mood: Euthymic Affect: Congruent Thought Processes and Associations: Undetermined Fund of Knowledge: Poor Thought Content: Undetermined Insight: Poor Judgement: Fair  Diagnosis: Autism, ADHD combined type  Treatment Plan: We will continue Adderall 20 mg daily ADHD, Zyprexa Zydis 15 mg 4 times daily for mood stabilization, and trazodone 50 mg at bedtime for sleep.Logan [name] will return for a followup in 3 months  Jayra Choyce, Gavin PoundEBORAH, MD

## 2014-12-17 ENCOUNTER — Telehealth (HOSPITAL_COMMUNITY): Payer: Self-pay | Admitting: *Deleted

## 2014-12-17 NOTE — Telephone Encounter (Signed)
Quanity limit exception form sent for review of Olanzapine starting January 2017. Called [phone] spoke with pharmacist Nahid to start prior authorization who states that a decision will be faxed.

## 2014-12-18 NOTE — Telephone Encounter (Signed)
noted 

## 2015-01-19 DIAGNOSIS — F79 Unspecified intellectual disabilities: Secondary | ICD-10-CM | POA: Diagnosis not present

## 2015-02-02 ENCOUNTER — Other Ambulatory Visit (HOSPITAL_COMMUNITY): Payer: Self-pay | Admitting: Psychiatry

## 2015-02-19 ENCOUNTER — Other Ambulatory Visit (HOSPITAL_COMMUNITY): Payer: Self-pay | Admitting: Psychiatry

## 2015-02-19 ENCOUNTER — Telehealth (HOSPITAL_COMMUNITY): Payer: Self-pay | Admitting: *Deleted

## 2015-02-19 DIAGNOSIS — F39 Unspecified mood [affective] disorder: Secondary | ICD-10-CM

## 2015-02-19 MED ORDER — TRAZODONE HCL 50 MG PO TABS: 50.0000 mg | ORAL_TABLET | Freq: Every day | ORAL | Status: DC

## 2015-02-19 NOTE — Telephone Encounter (Signed)
sent 

## 2015-02-19 NOTE — Telephone Encounter (Signed)
Pt pharmacy requesting refills for pt Trazodone 50 mg QHS. Pt medication was last filled 11-28-14 with 2 refills. Pt f/u appt is 02-27-15.

## 2015-02-20 NOTE — Telephone Encounter (Signed)
noted 

## 2015-02-27 ENCOUNTER — Encounter (HOSPITAL_COMMUNITY): Payer: Self-pay | Admitting: Psychiatry

## 2015-02-27 ENCOUNTER — Ambulatory Visit (INDEPENDENT_AMBULATORY_CARE_PROVIDER_SITE_OTHER): Payer: Medicare Other | Admitting: Psychiatry

## 2015-02-27 VITALS — Ht 66.0 in | Wt 167.0 lb

## 2015-02-27 DIAGNOSIS — F39 Unspecified mood [affective] disorder: Secondary | ICD-10-CM

## 2015-02-27 DIAGNOSIS — F902 Attention-deficit hyperactivity disorder, combined type: Secondary | ICD-10-CM

## 2015-02-27 MED ORDER — AMPHETAMINE-DEXTROAMPHETAMINE 20 MG PO TABS: 20.0000 mg | ORAL_TABLET | Freq: Every day | ORAL | Status: DC

## 2015-02-27 MED ORDER — TRAZODONE HCL 50 MG PO TABS: 50.0000 mg | ORAL_TABLET | Freq: Every day | ORAL | Status: DC

## 2015-02-27 MED ORDER — OLANZAPINE 15 MG PO TBDP: ORAL_TABLET | ORAL | Status: DC

## 2015-02-27 NOTE — Progress Notes (Signed)
Patient ID: Logan [name], male   DOB: 1985/12/27, 30 y.o.   MRN: [id number] Patient ID: Logan [name], male   DOB: Nov 14, 1985, 30 y.o.   MRN: [id number] Patient ID: Logan [name], male   DOB: 28-May-1985, 30 y.o.   MRN: [id number] Patient ID: Logan [name], male   DOB: 1985/08/01, 30 y.o.   MRN: [id number] Patient ID: Logan [name], male   DOB: 29-Jan-1986, 30 y.o.   MRN: [id number] Patient ID: Logan [name], male   DOB: 1986-01-03, 30 y.o.   MRN: [id number] Patient ID: Logan [name], male   DOB: Jun 18, 1985, 30 y.o.   MRN: [id number] Patient ID: Logan [name], male   DOB: 04/18/1985, 30 y.o.   MRN: [id number] Patient ID: Logan [name], male   DOB: March 09, 1985, 30 y.o.   MRN: [id number] Patient ID: Logan [name], male   DOB: [birth year], 30 y.o.   MRN: [id number]   [facility] Follow-up Outpatient Visit  Logan [name] Logan [name] Jul 09, 1985  Date: 12/06/2012   Subjective: This patient is a 30 year old single black male who lives with his mother  in [place]. He has completed the development disability program at [employer] and has nothing to do to occupy his time. The patient just saw Dora Sims PA in our [place] clinic about 2 weeks ago but the mother wanted to bring him here to establish care.  The patient has been autistic since a young age. Apparently he developed normally originally but began regressing around age 60 and lost his speech. He was always a very strong aggressive child. He's been in special education classes all his life. Unfortunately he's never been totally potty trained and still wears a diaper because he defecates in his pants. This is made it difficult to get them in any special needs program's or to get him workers. He also has problems with focus but has had a pretty good response to Adderall. He walks around around in a posturing position with both arms flexed and pulling his fingers. He drools a lot. His younger brother recently became agitated  and violent and the mother called social services to possibly get them both placed on the home. She is now reconsidering this and I suggested she look at other options such as getting more help in the home. Overall she thinks his medications are effective.  The patient and mom return after 3 months. He he is generally doing well. He has not been agitated or violent. His mother thinks he is ready to try day treatment program and she's trying to arrange this . Since his younger brother was put in a group home he is been much calmer. He sleeps well at night and the mother states he's actually more verbal and is trying to talk to her occasionally and he's also come out of the house onto the porch. He did have his lab work done last June at [facility] when he was having a tooth pulled and everything is within normal limits There were no vitals filed for this visit.  Mental Status Examination  Appearance: Casual drooling, gesturing Alert: Yes Attention:  poor  Cooperative: Yes Eye Contact absent Speech: Absent Psychomotor Activity: quiet today Memory/Concentration: Undetermined Oriented: Undetermined Mood: Euthymic Affect: Congruent Thought Processes and Associations: Undetermined Fund of Knowledge: Poor Thought Content: Undetermined Insight: Poor Judgement: Fair  Diagnosis: Autism, ADHD combined type  Treatment Plan: We will continue Adderall 20 mg daily ADHD, Zyprexa Zydis 15 mg 4 times daily  for mood stabilization, and trazodone 50 mg at bedtime for sleep.Logan [name] will return for a followup in 3 months  Logan [name], Logan PoundEBORAH, MD

## 2015-04-20 DIAGNOSIS — F79 Unspecified intellectual disabilities: Secondary | ICD-10-CM | POA: Diagnosis not present

## 2015-04-20 DIAGNOSIS — K5900 Constipation, unspecified: Secondary | ICD-10-CM | POA: Diagnosis not present

## 2015-05-11 ENCOUNTER — Telehealth (HOSPITAL_COMMUNITY): Payer: Self-pay | Admitting: *Deleted

## 2015-05-11 ENCOUNTER — Other Ambulatory Visit (HOSPITAL_COMMUNITY): Payer: Self-pay | Admitting: Psychiatry

## 2015-05-11 MED ORDER — OLANZAPINE 20 MG PO TBDP: 20.0000 mg | ORAL_TABLET | Freq: Four times a day (QID) | ORAL | Status: DC

## 2015-05-11 MED ORDER — TRAZODONE HCL 100 MG PO TABS: 100.0000 mg | ORAL_TABLET | Freq: Every day | ORAL | Status: DC

## 2015-05-11 NOTE — Telephone Encounter (Signed)
Spoke with pt mother and she stated she spoke with provider and pt mother showed understanding

## 2015-05-11 NOTE — Telephone Encounter (Signed)
zyprexa zydis increased to 20 mg qid, trazodone increased to 100 mg qhs

## 2015-05-11 NOTE — Telephone Encounter (Signed)
voice message from patient's mom.  she think he need a medication adjustment.   for about three weeks, meds not working and his aggression is rising, not sleeping at night, doing things he don't normally do.  throwing things, banging on wall.  She is having to watch him 24 hours.

## 2015-05-22 ENCOUNTER — Telehealth (HOSPITAL_COMMUNITY): Payer: Self-pay | Admitting: *Deleted

## 2015-05-22 NOTE — Telephone Encounter (Signed)
Prior authorization for olanzapine 20mg  received. Called spoke to Reuel BoomDaniel who gave approval #[id number] from 02/21/15-05/21/16. Called to notify pharmacy.

## 2015-05-28 ENCOUNTER — Ambulatory Visit (INDEPENDENT_AMBULATORY_CARE_PROVIDER_SITE_OTHER): Payer: Medicare Other | Admitting: Psychiatry

## 2015-05-28 ENCOUNTER — Encounter (HOSPITAL_COMMUNITY): Payer: Self-pay | Admitting: Psychiatry

## 2015-05-28 VITALS — BP 126/64 | HR 101 | Ht 66.0 in | Wt 159.8 lb

## 2015-05-28 DIAGNOSIS — F840 Autistic disorder: Secondary | ICD-10-CM

## 2015-05-28 DIAGNOSIS — F902 Attention-deficit hyperactivity disorder, combined type: Secondary | ICD-10-CM | POA: Diagnosis not present

## 2015-05-28 DIAGNOSIS — F39 Unspecified mood [affective] disorder: Secondary | ICD-10-CM | POA: Diagnosis not present

## 2015-05-28 MED ORDER — OLANZAPINE 20 MG PO TBDP: 20.0000 mg | ORAL_TABLET | Freq: Four times a day (QID) | ORAL | Status: DC

## 2015-05-28 MED ORDER — AMPHETAMINE-DEXTROAMPHETAMINE 20 MG PO TABS: 20.0000 mg | ORAL_TABLET | Freq: Every day | ORAL | Status: DC

## 2015-05-28 MED ORDER — TRAZODONE HCL 100 MG PO TABS: 100.0000 mg | ORAL_TABLET | Freq: Every day | ORAL | Status: DC

## 2015-05-28 NOTE — Progress Notes (Signed)
Patient ID: Logan [name] Stucke, male   DOB: 1986-01-28, 30 y.o.   MRN: [id number] Patient ID: Logan [name] Urenda, male   DOB: 1986-01-28, 30 y.o.   MRN: [id number] Patient ID: Logan [name] Kaeding, male   DOB: 1986-01-28, 30 y.o.   MRN: [id number] Patient ID: Logan [name] Risinger, male   DOB: 1986-01-28, 30 y.o.   MRN: [id number] Patient ID: Logan [name] Offutt, male   DOB: 1986-01-28, 30 y.o.   MRN: [id number] Patient ID: Logan [name] Brathwaite, male   DOB: 1986-01-28, 30 y.o.   MRN: [id number] Patient ID: Logan [name] Elsbernd, male   DOB: 1986-01-28, 30 y.o.   MRN: [id number] Patient ID: Logan [name] Behunin, male   DOB: 1986-01-28, 30 y.o.   MRN: [id number] Patient ID: Logan [name] Metoyer, male   DOB: 1986-01-28, 30 y.o.   MRN: [id number] Patient ID: Logan [name] Mukherjee, male   DOB: 1986-01-28, 30 y.o.   MRN: [id number] Patient ID: Logan [name] Brainerd, male   DOB: 1986-01-28, 30 y.o.   MRN: [id number]   [facility]  Logan [name] Cockerill 1986-01-28  Date: 12/06/2012   Subjective: This patient is a 30 year old single black male who lives with his mother  in [place]. He has completed the development disability program at [employer] and has nothing to do to occupy his time. The patient just saw Dora Simslan Watts PA in our [place] clinic about 2 weeks ago but the mother wanted to bring him here to establish care.  The patient has been autistic since a young age. Apparently he developed normally originally but began regressing around age 89 and lost his speech. He was always a very strong aggressive child. He's been in special education classes all his life. Unfortunately he's never been totally potty trained and still wears a diaper because he defecates in his pants. This is made it difficult to get them in any special needs program's or to get him workers. He also has problems with focus but has had a pretty good response to Adderall. He walks around around in a posturing position with both arms flexed and  pulling his fingers. He drools a lot. His younger brother recently became agitated and violent and the mother called social services to possibly get them both placed on the home. She is now reconsidering this and I suggested she look at other options such as getting more help in the home. Overall she thinks his medications are effective.  The patient and mom return after 3 months. Mom had called about 2 weeks ago and stated that the patient wasn't agitated breaking things in the home and unable to sleep. We did increase his Zyprexa to 20 mg 4 times a day and increase his trazodone no 100 mg at bedtime. He is doing better now he is calmer and more sedate and is sleeping well at night. Lately he's been staring at people and starting to masturbate more but this "comes in waves" according to his mom. She's looking at the possibility of group home placement because his last of agitation was so hard on her. For now however she's going to keep him at home Filed Vitals:   05/28/15 0911  BP: 126/64  Pulse: 101    Mental Status Examination  Appearance: Casual drooling, gesturing Alert: Yes Attention:  poor  Cooperative: Yes Eye Contact absent Speech: Absent Psychomotor Activity: quiet today Memory/Concentration: Undetermined Oriented: Undetermined Mood: Euthymic Affect: Congruent Thought Processes and Associations: Undetermined Fund of Knowledge: Poor Thought Content:  Undetermined Insight: Poor Judgement: Fair  Diagnosis: Autism, ADHD combined type  Treatment Plan: We will continue Adderall 20 mg daily ADHD, Zyprexa Zydis 20 mg 4 times daily for mood stabilization, and trazodone 100 mg at bedtime for sleep.Kendre will return for a followup in 3 months  Lucrecia Mcphearson, Gavin Pound, MD

## 2015-07-20 DIAGNOSIS — Z6825 Body mass index (BMI) 25.0-25.9, adult: Secondary | ICD-10-CM | POA: Diagnosis not present

## 2015-07-20 DIAGNOSIS — R040 Epistaxis: Secondary | ICD-10-CM | POA: Diagnosis not present

## 2015-07-20 DIAGNOSIS — F840 Autistic disorder: Secondary | ICD-10-CM | POA: Diagnosis not present

## 2015-08-17 ENCOUNTER — Ambulatory Visit (HOSPITAL_COMMUNITY): Payer: Self-pay | Admitting: Psychiatry

## 2015-08-27 ENCOUNTER — Ambulatory Visit (HOSPITAL_COMMUNITY): Payer: Self-pay | Admitting: Psychiatry

## 2015-09-10 ENCOUNTER — Encounter (HOSPITAL_COMMUNITY): Payer: Self-pay | Admitting: Psychiatry

## 2015-09-10 ENCOUNTER — Ambulatory Visit (INDEPENDENT_AMBULATORY_CARE_PROVIDER_SITE_OTHER): Payer: Medicare Other | Admitting: Psychiatry

## 2015-09-10 DIAGNOSIS — F902 Attention-deficit hyperactivity disorder, combined type: Secondary | ICD-10-CM | POA: Diagnosis not present

## 2015-09-10 MED ORDER — TRAZODONE HCL 100 MG PO TABS: 100.0000 mg | ORAL_TABLET | Freq: Every day | ORAL | 2 refills | Status: DC

## 2015-09-10 MED ORDER — AMPHETAMINE-DEXTROAMPHETAMINE 20 MG PO TABS: 20.0000 mg | ORAL_TABLET | Freq: Every day | ORAL | 0 refills | Status: DC

## 2015-09-10 MED ORDER — OLANZAPINE 20 MG PO TBDP: 20.0000 mg | ORAL_TABLET | Freq: Four times a day (QID) | ORAL | 2 refills | Status: DC

## 2015-09-10 NOTE — Progress Notes (Signed)
Patient ID: [name], male   DOB: [birth year], 30 y.o.   MRN: [id number] Patient ID: [name], male   DOB: 19-May-1985, 30 y.o.   MRN: [id number] Patient ID: [name], male   DOB: 15-Oct-1985, 30 y.o.   MRN: [id number] Patient ID: [name], male   DOB: September 13, 1985, 30 y.o.   MRN: [id number] Patient ID: [name], male   DOB: [birth year], 30 y.o.   MRN: [id number] Patient ID: [name], male   DOB: February 16, 1985, 30 y.o.   MRN: [id number] Patient ID: [name], male   DOB: 01-12-1986, 30 y.o.   MRN: [id number] Patient ID: [name], male   DOB: 13-Apr-1985, 30 y.o.   MRN: [id number] Patient ID: [name], male   DOB: 04-Nov-1985, 30 y.o.   MRN: [id number] Patient ID: [name], male   DOB: 1985-07-27, 30 y.o.   MRN: [id number] Patient ID: [name], male   DOB: [birth year], 30 y.o.   MRN: [id number]   [facility] Follow-up Outpatient Visit  [name] Feb 06, 1986  Date: 12/06/2012   Subjective: This patient is a 30 year old single black male who lives with his mother  in [place]. He has completed the development disability program at [employer] and has nothing to do to occupy his time. The patient just saw Dora Sims PA in our [place] clinic about 2 weeks ago but the mother wanted to bring him here to establish care.  The patient has been autistic since a young age. Apparently he developed normally originally but began regressing around age 52 and lost his speech. He was always a very strong aggressive child. He's been in special education classes all his life. Unfortunately he's never been totally potty trained and still wears a diaper because he defecates in his pants. This is made it difficult to get them in any special needs program's or to get him workers. He also has problems with focus but has had a pretty good response to Adderall. He walks around around in a posturing position with both arms flexed and  pulling his fingers. He drools a lot. His younger brother recently became agitated and violent and the mother called social services to possibly get them both placed on the home. She is now reconsidering this and I suggested she look at other options such as getting more help in the home. Overall she thinks his medications are effective.  The patient and mom return after 3 months. Fairly calm today and mom reports that he is doing well. He is calm and cooperative with her. He she has changed his diet and he has lost 17 pounds. She states that taking the sugars out of his diet has really helped his hyperactivity. He is less agitated. The mother states that her health is not that great and she is looking for placement for the patient. Overall he is sleeping well and enjoys watching TV and sitting on the porch. Vitals:   09/10/15 0814  BP: 134/89  Pulse: 100    Mental Status Examination  Appearance: Casual, gesturing Alert: Yes Attention:  poor  Cooperative: Yes Eye Contact absent Speech: Absent Psychomotor Activity: quiet today Memory/Concentration: Undetermined Oriented: Undetermined Mood: Euthymic Affect: Congruent Thought Processes and Associations: Undetermined Fund of Knowledge: Poor Thought Content: Undetermined Insight: Poor Judgement: Fair  Diagnosis: Autism, ADHD combined type  Treatment Plan: We will continue Adderall 20 mg daily ADHD, Zyprexa Zydis 20 mg 4 times daily for  mood stabilization, and trazodone 100 mg at bedtime for sleep.Log[place] [name] will return for a followup in 3 months  [name], [employer], MDPatient ID: [name], male   DOB: 1985-10-17, 30 y.o.   MRN: [id number]

## 2015-10-21 DIAGNOSIS — F840 Autistic disorder: Secondary | ICD-10-CM | POA: Diagnosis not present

## 2015-10-21 DIAGNOSIS — F72 Severe intellectual disabilities: Secondary | ICD-10-CM | POA: Diagnosis not present

## 2015-10-21 DIAGNOSIS — I10 Essential (primary) hypertension: Secondary | ICD-10-CM | POA: Diagnosis not present

## 2015-12-11 ENCOUNTER — Ambulatory Visit (INDEPENDENT_AMBULATORY_CARE_PROVIDER_SITE_OTHER): Payer: Medicare Other | Admitting: Psychiatry

## 2015-12-11 ENCOUNTER — Encounter (HOSPITAL_COMMUNITY): Payer: Self-pay | Admitting: Psychiatry

## 2015-12-11 VITALS — BP 129/87 | HR 120 | Ht 66.0 in | Wt 141.0 lb

## 2015-12-11 DIAGNOSIS — F39 Unspecified mood [affective] disorder: Secondary | ICD-10-CM | POA: Diagnosis not present

## 2015-12-11 DIAGNOSIS — F902 Attention-deficit hyperactivity disorder, combined type: Secondary | ICD-10-CM | POA: Diagnosis not present

## 2015-12-11 MED ORDER — AMPHETAMINE-DEXTROAMPHETAMINE 20 MG PO TABS: 20.0000 mg | ORAL_TABLET | Freq: Every day | ORAL | 0 refills | Status: DC

## 2015-12-11 MED ORDER — OLANZAPINE 20 MG PO TBDP: 20.0000 mg | ORAL_TABLET | Freq: Four times a day (QID) | ORAL | 2 refills | Status: DC

## 2015-12-11 MED ORDER — TRAZODONE HCL 100 MG PO TABS: 100.0000 mg | ORAL_TABLET | Freq: Every day | ORAL | 2 refills | Status: DC

## 2015-12-11 MED ORDER — CLONAZEPAM 2 MG PO TBDP: ORAL_TABLET | ORAL | 0 refills | Status: DC

## 2015-12-11 NOTE — Progress Notes (Signed)
Patient ID: Hallis Meditz Gadd, male   DOB: Dec 02, 1985, 30 y.o.   MRN: [id number] Patient ID: Hermenegildo Clausen Vanvranken, male   DOB: Oct 08, 1985, 30 y.o.   MRN: [id number] Patient ID: Camrin Gearheart Wilmer, male   DOB: 1985/04/08, 30 y.o.   MRN: [id number] Patient ID: Sharif Rendell Finamore, male   DOB: [birth year], 30 y.o.   MRN: [id number] Patient ID: Elam Ellis Koslosky, male   DOB: 1985-10-22, 30 y.o.   MRN: [id number] Patient ID: Erek Kowal Haddon, male   DOB: 1985/08/24, 30 y.o.   MRN: [id number] Patient ID: Adeyemi Hamad Waltermire, male   DOB: [birth year], 30 y.o.   MRN: [id number] Patient ID: Benton Tooker Mccalip, male   DOB: 14-Jun-1985, 30 y.o.   MRN: [id number] Patient ID: Giovan Pinsky Tallarico, male   DOB: 29-Jul-1985, 30 y.o.   MRN: [id number] Patient ID: Abiel Antrim Catanzaro, male   DOB: 10-19-1985, 30 y.o.   MRN: [id number] Patient ID: Kalib Bhagat Sereno, male   DOB: 1985-11-29, 30 y.o.   MRN: [id number]   [facility] Follow-up Outpatient Visit  Jocelyn Lowery Bisig 11/28/1985  Date: 12/06/2012   Subjective: This patient is a 30 year old single black male who lives with his mother  in [place]. He has completed the development disability program at [employer] and has nothing to do to occupy his time. The patient just saw Dora Sims PA in our [place] clinic about 2 weeks ago but the mother wanted to bring him here to establish care.  The patient has been autistic since a young age. Apparently he developed normally originally but began regressing around age 46 and lost his speech. He was always a very strong aggressive child. He's been in special education classes all his life. Unfortunately he's never been totally potty trained and still wears a diaper because he defecates in his pants. This is made it difficult to get them in any special needs program's or to get him workers. He also has problems with focus but has had a pretty good response to Adderall. He walks around around in a posturing position with both arms flexed and  pulling his fingers. He drools a lot. His younger brother recently became agitated and violent and the mother called social services to possibly get them both placed on the home. She is now reconsidering this and I suggested she look at other options such as getting more help in the home. Overall she thinks his medications are effective.  The patient and mom return after 3 months. The mother states that he is doing about the same. His primary doctor put him on a clonidine patch because his blood pressure was high in their office. His blood pressures of always been normal here. The mother states that every now and then Tramel won't sleep for several days in a row and seems agitated but she can't tell why since he can't speak. She doesn't know if he is in pain or something else is bothering him. Most the time she is able to manage him but he at times is very demanding and she is still trying to find out of home placement. She's not had much luck getting help from the [facility] system. He is drooling more which is probably due to the increased Zyprexa out but on the other hand it seems to keep him more under control. I did offer to add clonazepam which is what he uses for his dental procedures and have him take it at night when he is agitated  and the mother agrees Vitals:   12/11/15 0948  BP: 129/87  Pulse: (!) 120    Mental Status Examination  Appearance: Casual, gesturingAnd drooling Alert: Yes Attention:  poor  Cooperative: Yes Eye Contact absent Speech: Absent Psychomotor Activity: quiet today Memory/Concentration: Undetermined Oriented: Undetermined Mood: Euthymic Affect: Congruent Thought Processes and Associations: Undetermined Fund of Knowledge: Poor Thought Content: Undetermined Insight: Poor Judgement: Fair  Diagnosis: Autism, ADHD combined type  Treatment Plan: We will continue Adderall 20 mg daily ADHD, Zyprexa Zydis 20 mg 4 times daily for mood stabilization, and trazodone 100  mg at bedtime for sleep I have given the mother clonazepam 2 mg to use prior to his dental procedure and also can be used if he can't sleep.[name] will return for a followup in 3 months  ROSS, [facility], MDPatient ID: Ned ClinesCharlie M Aleman, male   DOB: 1985/11/24, 30 y.o.   MRN: [id number]

## 2016-01-20 DIAGNOSIS — F840 Autistic disorder: Secondary | ICD-10-CM | POA: Diagnosis not present

## 2016-01-20 DIAGNOSIS — Z6824 Body mass index (BMI) 24.0-24.9, adult: Secondary | ICD-10-CM | POA: Diagnosis not present

## 2016-01-20 DIAGNOSIS — K5900 Constipation, unspecified: Secondary | ICD-10-CM | POA: Diagnosis not present

## 2016-01-20 DIAGNOSIS — I10 Essential (primary) hypertension: Secondary | ICD-10-CM | POA: Diagnosis not present

## 2016-02-13 IMAGING — CT CT MAXILLOFACIAL W/O CM
3 of 8 series · 15 of 47 positions shown, 19 images · non-contrast
Comparison: None.

CLINICAL DATA: Right side dental pain and facial swelling.

EXAM:
CT MAXILLOFACIAL WITHOUT CONTRAST
TECHNIQUE: Multidetector CT imaging of the maxillofacial structures was
performed. Multiplanar CT image reconstructions were also generated.
A small metallic BB was placed on the right temple in order to
reliably differentiate right from left.

[Series 2: max soft 2.0 h31s · axial · 0.38mm/px · z∈[+27,+196]mm · 13 of 125 slices shown, 17 images]
[im 6/125  brain]
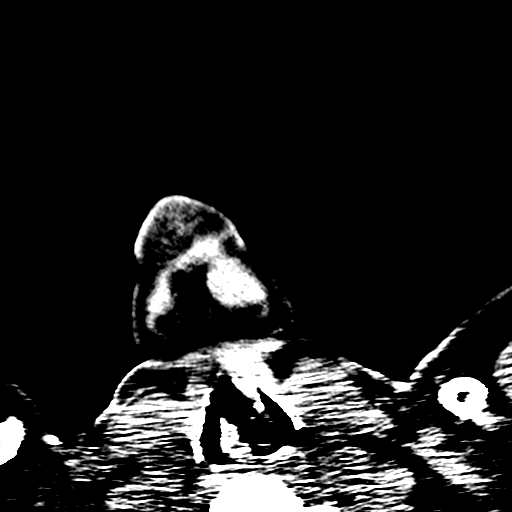
[im 6/125  bone]
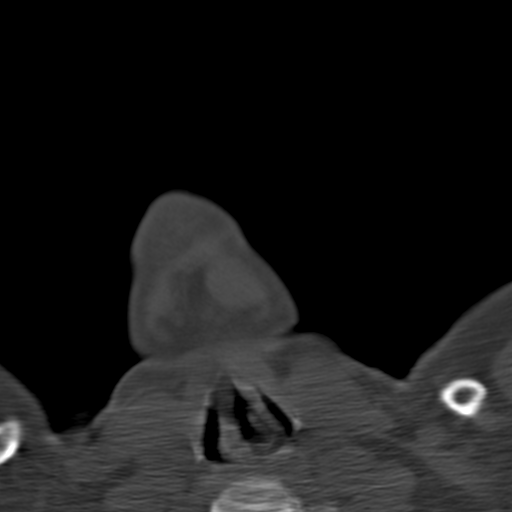
[im 17/125  bone]
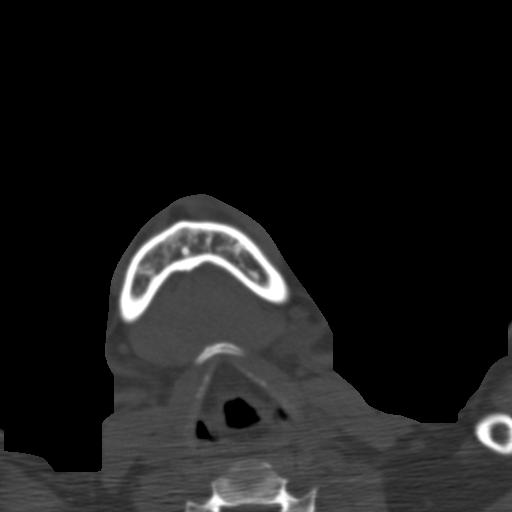
[im 29/125  bone]
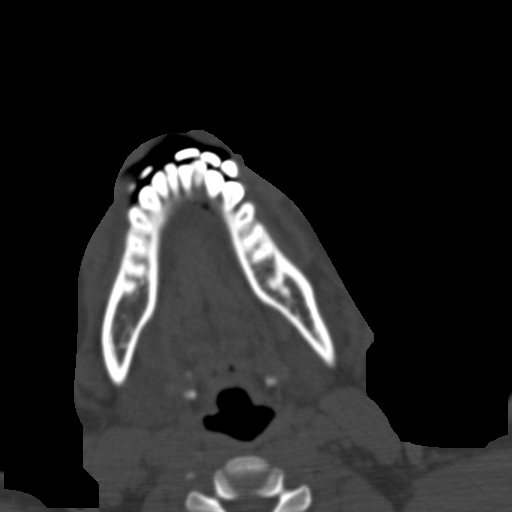
[im 34/125  bone]
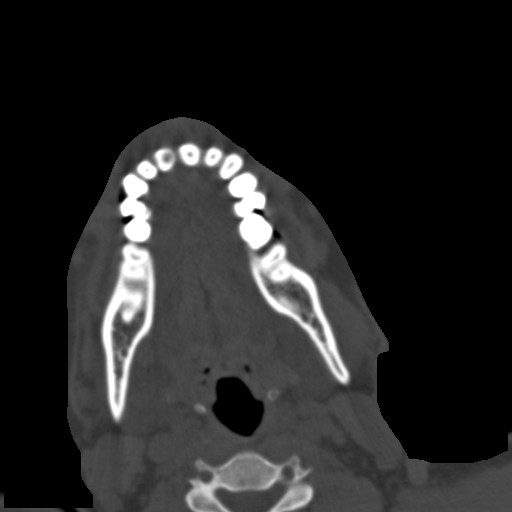
[im 46/125  brain]
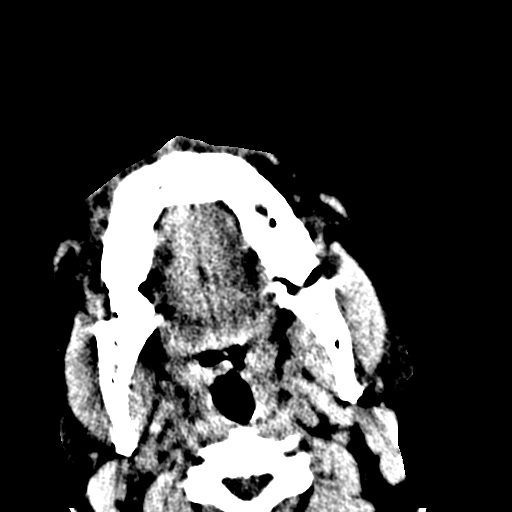
[im 46/125  bone]
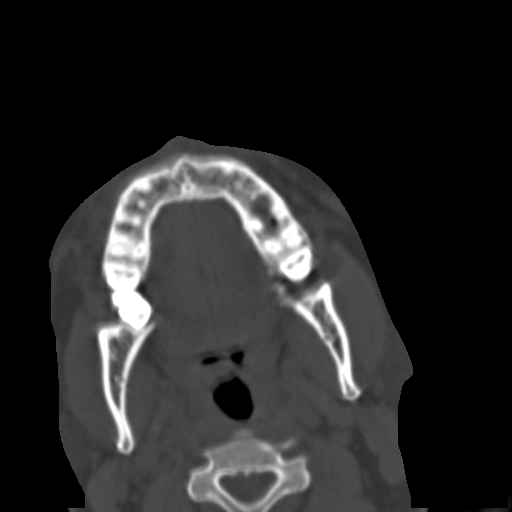
[im 51/125  bone]
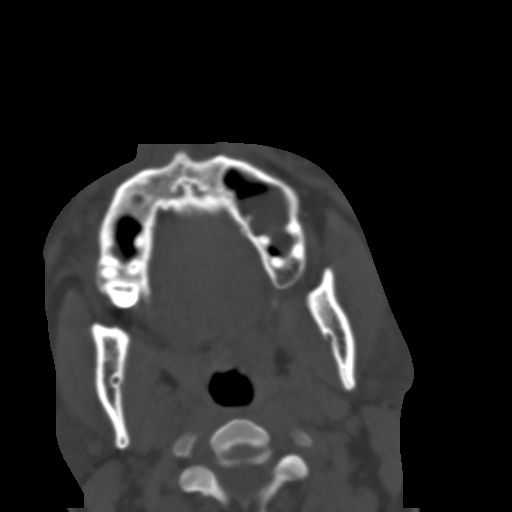
[im 63/125  bone]
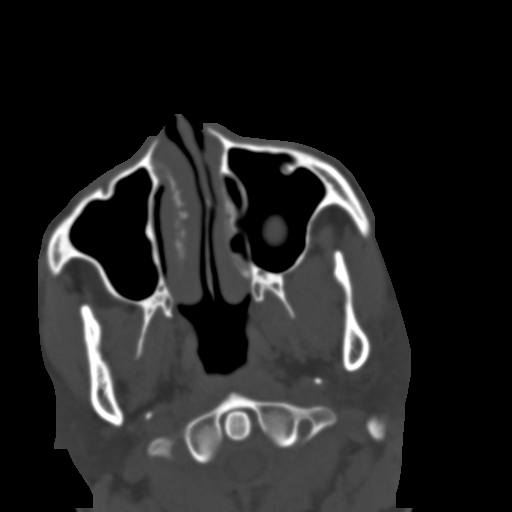
[im 74/125  bone]
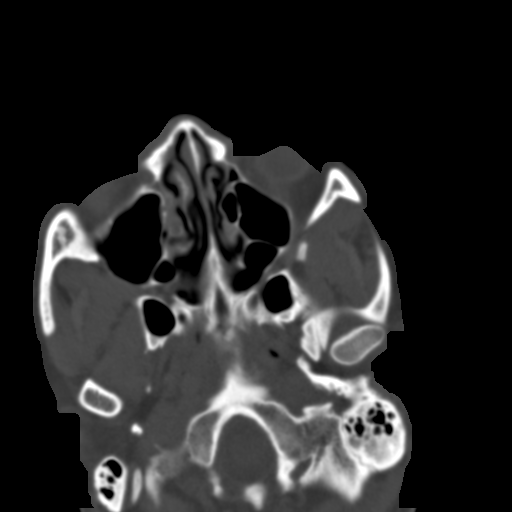
[im 79/125  brain]
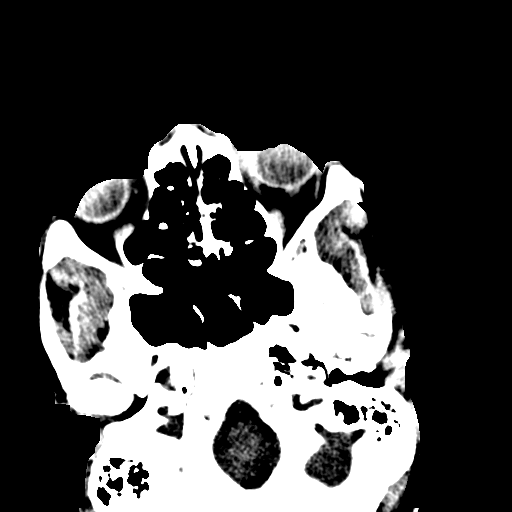
[im 79/125  bone]
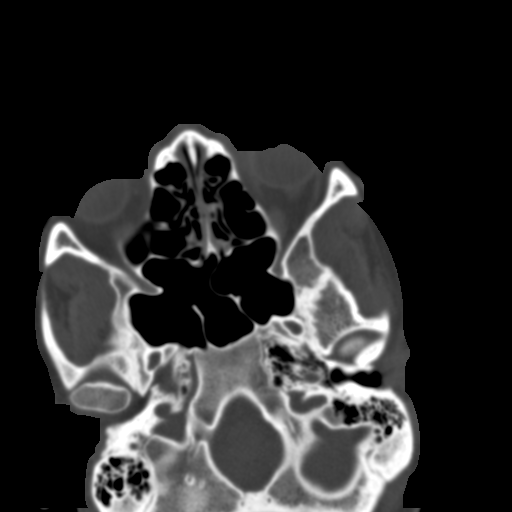
[im 91/125  bone]
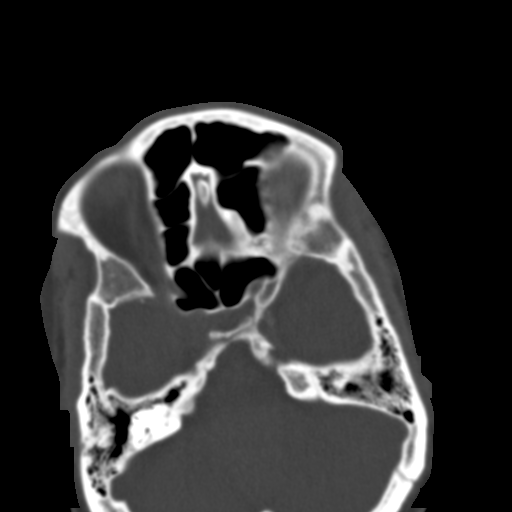
[im 96/125  bone]
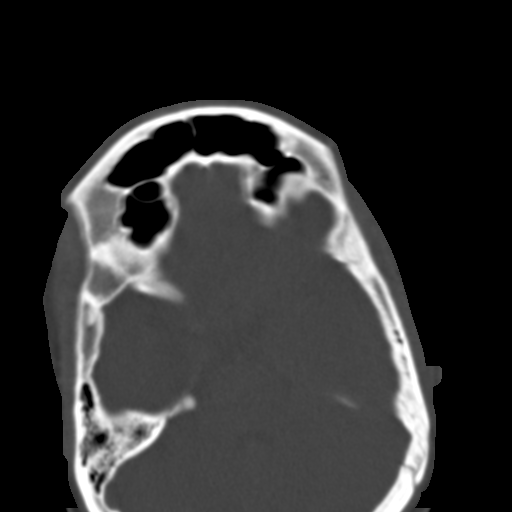
[im 108/125  bone]
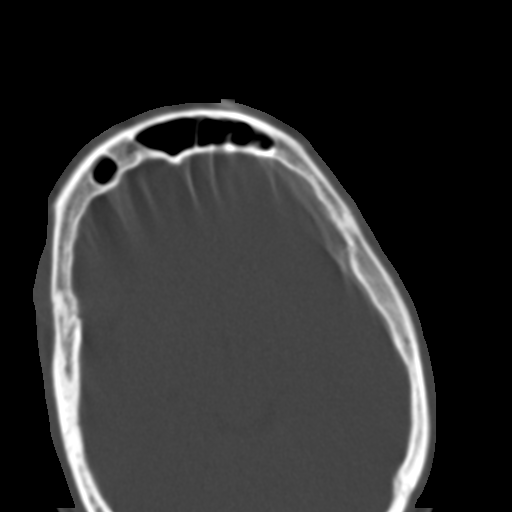
[im 119/125  brain]
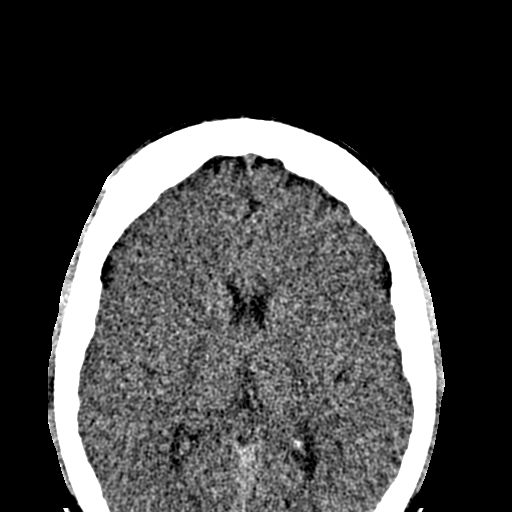
[im 119/125  bone]
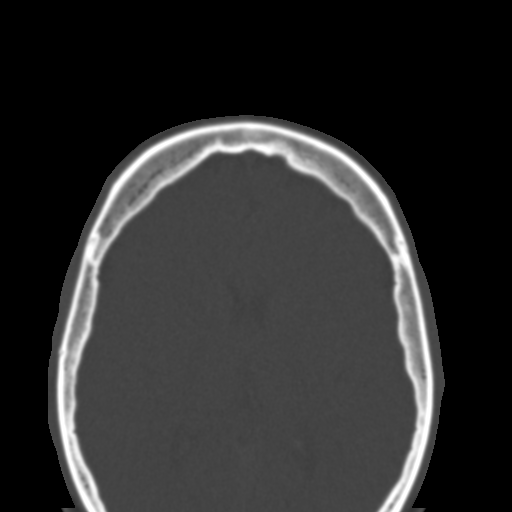

[Series 4: max st coronal · coronal · 0.33mm/px · 1 of 82 slices shown]
[im 41/82  bone]
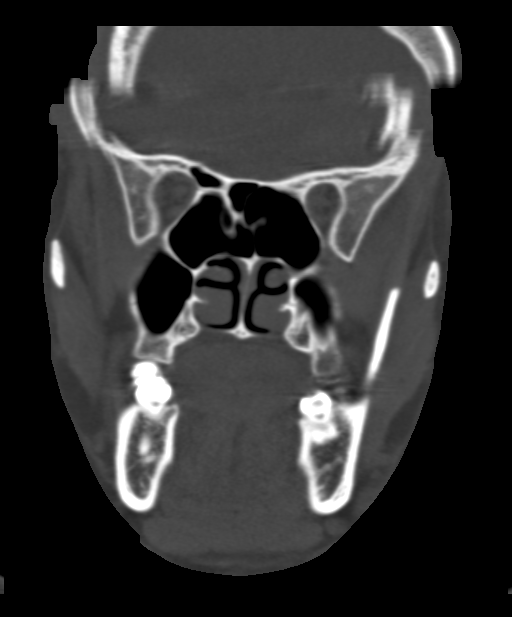

[Series 7: max bone sagittal 2.0 spo · sagittal · 0.34mm/px · 1 of 111 slices shown]
[im 56/111  bone]
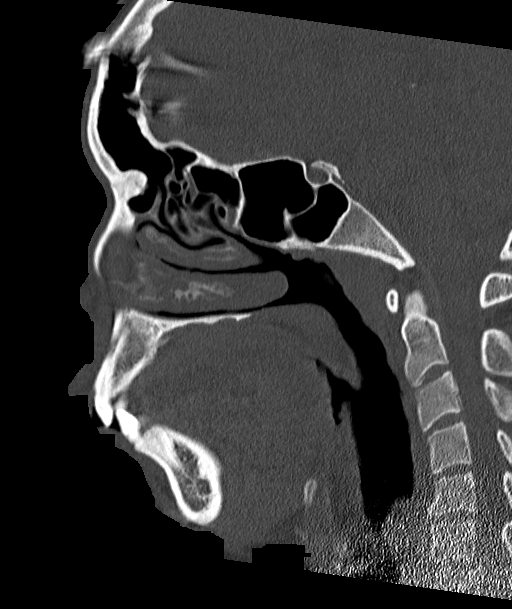

[15 of 47 positions shown; findings below may reference images not displayed]

FINDINGS: There are cavities in tooth 8 and tooth 17. There is no evidence of
periapical abscess. No appreciable soft tissue swelling adjacent to
the mandible or maxilla. The parotid glands and submandibular glands
are normal. Tonsils are normal.

There is a benign retention cyst in the base of the left maxillary
sinus. Minimal mucosal thickening on the base of the right maxillary
sinus. The paranasal sinuses are otherwise clear. Mastoid air cells
are clear. The visualized portion of the brain is normal.

Prevertebral soft tissues are normal.
IMPRESSION: No evidence of soft tissue abscess or periapical abscess. Cavities
as noted.

## 2016-03-07 ENCOUNTER — Ambulatory Visit (HOSPITAL_COMMUNITY): Payer: Self-pay | Admitting: Psychiatry

## 2016-03-10 ENCOUNTER — Ambulatory Visit (INDEPENDENT_AMBULATORY_CARE_PROVIDER_SITE_OTHER): Payer: Medicare Other | Admitting: Psychiatry

## 2016-03-10 ENCOUNTER — Encounter (HOSPITAL_COMMUNITY): Payer: Self-pay | Admitting: Psychiatry

## 2016-03-10 VITALS — BP 137/93 | HR 133 | Ht 66.0 in | Wt 141.0 lb

## 2016-03-10 DIAGNOSIS — F902 Attention-deficit hyperactivity disorder, combined type: Secondary | ICD-10-CM | POA: Diagnosis not present

## 2016-03-10 DIAGNOSIS — F39 Unspecified mood [affective] disorder: Secondary | ICD-10-CM | POA: Diagnosis not present

## 2016-03-10 DIAGNOSIS — F840 Autistic disorder: Secondary | ICD-10-CM

## 2016-03-10 MED ORDER — AMPHETAMINE-DEXTROAMPHETAMINE 30 MG PO TABS: 30.0000 mg | ORAL_TABLET | ORAL | 0 refills | Status: DC

## 2016-03-10 MED ORDER — OLANZAPINE 20 MG PO TBDP: 20.0000 mg | ORAL_TABLET | Freq: Four times a day (QID) | ORAL | 2 refills | Status: DC

## 2016-03-10 MED ORDER — TRAZODONE HCL 100 MG PO TABS: 200.0000 mg | ORAL_TABLET | Freq: Every day | ORAL | 2 refills | Status: DC

## 2016-03-10 MED ORDER — CLONAZEPAM 2 MG PO TBDP: 2.0000 mg | ORAL_TABLET | Freq: Every day | ORAL | 2 refills | Status: DC

## 2016-03-10 NOTE — Progress Notes (Signed)
Patient ID: Jaramiah Bossard Seyfried, male   DOB: [birth year], 31 y.o.   MRN: [id number] Patient ID: Adrain Nesbit Shaft, male   DOB: [birth year], 31 y.o.   MRN: [id number] Patient ID: Aarian Griffie Savo, male   DOB: May 26, 1985, 31 y.o.   MRN: [id number] Patient ID: Andersson Larrabee Wenke, male   DOB: 1985-11-02, 31 y.o.   MRN: [id number] Patient ID: Khaleem Burchill Yakel, male   DOB: 1985/10/20, 31 y.o.   MRN: [id number] Patient ID: Ricci Dirocco Brinkman, male   DOB: Jan 18, 1986, 31 y.o.   MRN: [id number] Patient ID: Loyal Holzheimer Marney, male   DOB: August 08, 1985, 31 y.o.   MRN: [id number] Patient ID: Roel Douthat Utke, male   DOB: 10-19-1985, 31 y.o.   MRN: [id number] Patient ID: Eldrige Pitkin Deardorff, male   DOB: Jan 12, 1986, 31 y.o.   MRN: [id number] Patient ID: Rhylen Shaheen Dingee, male   DOB: 05/17/1985, 31 y.o.   MRN: [id number] Patient ID: Daishawn Lauf Kinkaid, male   DOB: Jul 12, 1985, 31 y.o.   MRN: [id number]   [facility] Follow-up Outpatient Visit  Ananias Kolander Schaner [birth year]  Date: 12/06/2012   Subjective: This patient is a 31 year old single black male who lives with his mother  in [place]. He has completed the development disability program at [employer] and has nothing to do to occupy his time. The patient just saw Dora Sims PA in our [place] clinic about 2 weeks ago but the mother wanted to bring him here to establish care.  The patient has been autistic since a young age. Apparently he developed normally originally but began regressing around age 72 and lost his speech. He was always a very strong aggressive child. He's been in special education classes all his life. Unfortunately he's never been totally potty trained and still wears a diaper because he defecates in his pants. This is made it difficult to get them in any special needs program's or to get him workers. He also has problems with focus but has had a pretty good response to Adderall. He walks around around in a posturing position with both arms flexed and  pulling his fingers. He drools a lot. His younger brother recently became agitated and violent and the mother called social services to possibly get them both placed on the home. She is now reconsidering this and I suggested she look at other options such as getting more help in the home. Overall she thinks his medications are effective.  The patient and mom return after 3 months. The mother states that he is not doing well. He goes 1 or 2 nights in a row without sleeping. He's wide open the next day and can't settle down. He was very agitated today and needed a lot of redirection and Running around the office. The mother thinks the Adderall is not working nor is the trazodone keeping him asleep. I gave her some clonazepam last time and it did help calm him down a little bit. He's not been violent but just constantly hyperactive. His blood pressure is a little high again today but he is been running around the office prior to having it taken Vitals:   03/10/16 0825  BP: (!) 137/93  Pulse: (!) 133    Mental Status Examination  Appearance: Casual, gesturingAnd drooling,Very hyperactive moving to the office but eventually settled down Alert: Yes Attention:  poor  Cooperative: Yes Eye Contact absent Speech: Absent Psychomotor Activity: quiet today Memory/Concentration: Undetermined Oriented: Undetermined Mood: Euthymic Affect: Congruent Thought Processes and  Associations: Undetermined Fund of Knowledge: Poor Thought Content: Undetermined Insight: Poor Judgement: Fair  Diagnosis: Autism, ADHD combined type  Treatment Plan: We will continue Adderall but increase the dosage to 30 mg daily ADHD, Zyprexa Zydis 20 mg 4 times daily for mood stabilization, and trazodone will be increased to 200 mg at bedtime for sleep I have given the mother clonazepam 2 mg which can be used if he can't sleep.[name] will return for a followup in 4 weeks  Adeleine Pask, [facility], MDPatient ID: Ned ClinesCharlie M Grill, male   DOB:  09/25/1985, 31 y.o.   MRN: [id number]

## 2016-04-11 ENCOUNTER — Encounter (HOSPITAL_COMMUNITY): Payer: Self-pay | Admitting: Psychiatry

## 2016-04-11 ENCOUNTER — Ambulatory Visit (INDEPENDENT_AMBULATORY_CARE_PROVIDER_SITE_OTHER): Payer: Medicare Other | Admitting: Psychiatry

## 2016-04-11 VITALS — BP 118/88 | HR 130 | Ht 66.0 in | Wt 147.0 lb

## 2016-04-11 DIAGNOSIS — F902 Attention-deficit hyperactivity disorder, combined type: Secondary | ICD-10-CM | POA: Diagnosis not present

## 2016-04-11 DIAGNOSIS — F840 Autistic disorder: Secondary | ICD-10-CM | POA: Diagnosis not present

## 2016-04-11 MED ORDER — AMPHETAMINE-DEXTROAMPHETAMINE 30 MG PO TABS: 30.0000 mg | ORAL_TABLET | ORAL | 0 refills | Status: DC

## 2016-04-11 MED ORDER — OLANZAPINE 20 MG PO TBDP: 20.0000 mg | ORAL_TABLET | Freq: Four times a day (QID) | ORAL | 2 refills | Status: DC

## 2016-04-11 MED ORDER — CLONAZEPAM 2 MG PO TBDP: 2.0000 mg | ORAL_TABLET | Freq: Every day | ORAL | 2 refills | Status: DC

## 2016-04-11 MED ORDER — TRAZODONE HCL 100 MG PO TABS
200.0000 mg | ORAL_TABLET | Freq: Every day | ORAL | 2 refills | Status: DC
Start: 2016-04-11 — End: 2016-07-05

## 2016-04-11 NOTE — Progress Notes (Signed)
Patient ID: Logan [name], male   DOB: 10-31-1985, 31 y.o.   MRN: [id number] Patient ID: Logan [name], male   DOB: 11-Aug-1985, 31 y.o.   MRN: [id number] Patient ID: Logan [name], male   DOB: Sep 12, 1985, 31 y.o.   MRN: [id number] Patient ID: Logan [name], male   DOB: 06-08-1985, 31 y.o.   MRN: [id number] Patient ID: [place] [name], male   DOB: January 21, 1986, 31 y.o.   MRN: [id number] Patient ID: Logan [name], male   DOB: [birth year], 31 y.o.   MRN: [id number] Patient ID: Logan [name], male   DOB: 1985/09/09, 31 y.o.   MRN: [id number] Patient ID: Logan [name], male   DOB: Nov 13, 1985, 31 y.o.   MRN: [id number] Patient ID: Logan [name], male   DOB: Apr 20, 1985, 31 y.o.   MRN: [id number] Patient ID: Logan [name], male   DOB: 1985/07/14, 31 y.o.   MRN: [id number] Patient ID: Logan [name], male   DOB: June 06, 1985, 31 y.o.   MRN: [id number]   [facility] Follow-up Outpatient Visit  Logan [name] 1985-12-30  Date: 12/06/2012   Subjective: This patient is a 31 year old single black male who lives with his mother  in [name]. He has completed the development disability program at [name] and has nothing to do to occupy his time. The patient just saw Logan Sims PA in our [place] clinic about 2 weeks ago but the mother wanted to bring him here to establish care.  The patient has been autistic since a young age. Apparently he developed normally originally but began regressing around age 31 and lost his speech. He was always a very strong aggressive child. He's been in special education classes all his life. Unfortunately he's never been totally potty trained and still wears a diaper because he defecates in his pants. This is made it difficult to get them in any special needs program's or to get him workers. He also has problems with focus but has had a pretty good response to Adderall. He walks around around in a posturing position with both arms flexed and  pulling his fingers. He drools a lot. His younger brother recently became agitated and violent and the mother called social services to possibly get them both placed on the home. She is now reconsidering this and I suggested she look at other options such as getting more help in the home. Overall she thinks his medications are effective.  The patient and mom return after 4 weeks. The patient is doing better now that we've added clonazepam at night as well as extra trazodone if needed. He is sleeping to the night in the mom is able to get more rest. He still somewhat agitated during the day but the increased Adderall is helped to some degree. His mom can get him into any community programs because he still defecates into his pull-up and nobody wants to deal with this in a community-based program. She is tried repeatedly to get him potty trained. For now however that things are more stable at home and they were and she is content with the current situation Vitals:   04/11/16 0918  BP: 118/88  Pulse: (!) 130    Mental Status Examination  Appearance: Casual, gesturingAnd drooling,Less hyperactive today compared to last visit Alert: Yes Attention:  poor  Cooperative: Yes Eye Contact absent Speech: Absent Psychomotor Activity: quiet today Memory/Concentration: Undetermined Oriented: Undetermined Mood: Euthymic Affect: Congruent Thought Processes and Associations: Undetermined Fund of Knowledge:  Poor Thought Content: Undetermined Insight: Poor Judgement: Fair  Diagnosis: Autism, ADHD combined type  Treatment Plan: We will continue Adderall 30 mg daily ADHD, Zyprexa Zydis 20 mg 4 times daily for mood stabilization, and trazodone will be increased to 200 mg at bedtime for sleep I have given the mother clonazepam 2 mg which can be used if he can't sleep.Logan [name] will return for a followup in 3 months  [name], [facility] HospitalDEBORAH, MDPatient ID: Logan ClinesCharlie M [name], male   DOB: 01-09-1986, 31 y.o.   MRN: [id number]

## 2016-04-25 DIAGNOSIS — I10 Essential (primary) hypertension: Secondary | ICD-10-CM | POA: Diagnosis not present

## 2016-04-25 DIAGNOSIS — F840 Autistic disorder: Secondary | ICD-10-CM | POA: Diagnosis not present

## 2016-04-25 DIAGNOSIS — K5900 Constipation, unspecified: Secondary | ICD-10-CM | POA: Diagnosis not present

## 2016-06-14 ENCOUNTER — Other Ambulatory Visit (HOSPITAL_COMMUNITY): Payer: Self-pay | Admitting: Psychiatry

## 2016-06-17 ENCOUNTER — Other Ambulatory Visit (HOSPITAL_COMMUNITY): Payer: Self-pay | Admitting: Psychiatry

## 2016-06-17 ENCOUNTER — Telehealth (HOSPITAL_COMMUNITY): Payer: Self-pay | Admitting: *Deleted

## 2016-06-17 MED ORDER — OLANZAPINE 20 MG PO TBDP: 20.0000 mg | ORAL_TABLET | Freq: Four times a day (QID) | ORAL | 2 refills | Status: DC

## 2016-06-17 NOTE — Telephone Encounter (Signed)
phone call from patient's mom, [facility] on the OLANZapine, and the clonazepam.   She said they have been faxing the request.

## 2016-06-17 NOTE — Telephone Encounter (Signed)
Prior authorization for Olanzapine approved. Called to notify pharmacy was told the cost is now $1.25.

## 2016-06-17 NOTE — Telephone Encounter (Signed)
Prior authorization for Olanzapine received. Submitted online with cover my meds. Awaiting response to be faxed to office.

## 2016-06-17 NOTE — Telephone Encounter (Signed)
noted 

## 2016-06-17 NOTE — Telephone Encounter (Signed)
We have gotten PA before. He CANNOT be off this med, he will get violent and need to be hospitalized

## 2016-06-17 NOTE — Telephone Encounter (Signed)
Called pt pharmacy due to refill fax that was sent to office. RMA asked pt pharmacy if they bubble pack pt scripts because per pt chart, his Olanzapine ODT 20 mg was last sent to them on 04-11-2016 with 120 tablets 2 refills. Per pt pharmacy, pt mother last picked up script on 05-17-2016 and first picked up script on 04-13-2016. Pharmacist then stated they tried to run medication with insurance and it stated it was $4,000 but there was an error with the insurance on their part. Per pharmacist, she need to look at this request again. Per pharmacist, she just tried to rerun the medication with pt insurance and the message she is getting is the insurance stating the max pt can take for this medication is BID and provider have pt on QID. Per pharmacist, it looks like this is not a refill, this is a P.A that is needed. [facility]. Per pharmacist, from her end it looks like pt have 1 more refill.

## 2016-06-17 NOTE — Telephone Encounter (Signed)
Called pt pharmacy due to refill fax that was sent to office. RMA asked pt pharmacy if they bubble pack pt scripts because per pt chart, his Olanzapine ODT 20 mg was last sent to them on 04-11-2016 with 120 tablets 2 refills. Per pt pharmacy, pt mother last picked up script on 05-17-2016 and first picked up script on 04-13-2016. Pharmacist then stated they tried to run medication with insurance and it stated it was $4,000 but there was an error with the insurance on their part. Per pharmacist, she need to look at this request again. Per pharmacist, she just tried to rerun the medication with pt insurance and the message she is getting is the insurance stating the max pt can take for this medication is BID and provider have pt on QID. Per pharmacist, it looks like this is not a refill, this is a P.A that is needed. [facility]

## 2016-06-17 NOTE — Telephone Encounter (Signed)
Olanzapine sent. Call pharmacy about clonazepam, he should have had refills but if not you may call in 30 days

## 2016-06-20 ENCOUNTER — Telehealth (HOSPITAL_COMMUNITY): Payer: Self-pay | Admitting: *Deleted

## 2016-06-20 NOTE — Telephone Encounter (Signed)
Voice message from patient's mother, said the pharmacy is still waiting on authorization for patient's medicine.

## 2016-06-20 NOTE — Telephone Encounter (Signed)
Octavia please call mom. Per alli it has been authorized

## 2016-06-22 NOTE — Telephone Encounter (Signed)
Spoke with pt mother and she stated she received a letter that stated pt medication was approved. Per pt mother, she was calling about getting pt Klonopin refilled but she found the printed script and she turned it into the pharmacy. Per pt she do not need anything right now.

## 2016-06-27 ENCOUNTER — Telehealth (HOSPITAL_COMMUNITY): Payer: Self-pay | Admitting: *Deleted

## 2016-06-27 NOTE — Telephone Encounter (Signed)
Received prior authorization for Olanzapine. Checked online with cover my meds and it was approved 06/20/16.

## 2016-07-05 ENCOUNTER — Ambulatory Visit (INDEPENDENT_AMBULATORY_CARE_PROVIDER_SITE_OTHER): Payer: Medicare Other | Admitting: Psychiatry

## 2016-07-05 ENCOUNTER — Encounter (HOSPITAL_COMMUNITY): Payer: Self-pay | Admitting: Psychiatry

## 2016-07-05 VITALS — BP 134/94 | HR 163 | Ht 66.0 in | Wt 147.0 lb

## 2016-07-05 DIAGNOSIS — F840 Autistic disorder: Secondary | ICD-10-CM

## 2016-07-05 DIAGNOSIS — F39 Unspecified mood [affective] disorder: Secondary | ICD-10-CM | POA: Diagnosis not present

## 2016-07-05 DIAGNOSIS — F902 Attention-deficit hyperactivity disorder, combined type: Secondary | ICD-10-CM | POA: Diagnosis not present

## 2016-07-05 MED ORDER — TRAZODONE HCL 100 MG PO TABS
200.0000 mg | ORAL_TABLET | Freq: Every day | ORAL | 2 refills | Status: DC
Start: 2016-07-05 — End: 2016-09-25

## 2016-07-05 MED ORDER — OLANZAPINE 20 MG PO TBDP
20.0000 mg | ORAL_TABLET | Freq: Four times a day (QID) | ORAL | 2 refills | Status: DC
Start: 2016-07-05 — End: 2016-10-05

## 2016-07-05 MED ORDER — CLONAZEPAM 2 MG PO TBDP
2.0000 mg | ORAL_TABLET | Freq: Every day | ORAL | 2 refills | Status: DC
Start: 2016-07-05 — End: 2016-10-05

## 2016-07-05 MED ORDER — AMPHETAMINE-DEXTROAMPHETAMINE 30 MG PO TABS: 30.0000 mg | ORAL_TABLET | ORAL | 0 refills | Status: DC

## 2016-07-05 NOTE — Progress Notes (Signed)
Patient ID: Logan ClinesCharlie M Fotheringham, male   DOB: 05-21-1985, 31 y.o.   MRN: [id number] Patient ID: Logan ClinesCharlie M Zinn, male   DOB: 05-21-1985, 31 y.o.   MRN: [id number] Patient ID: Logan ClinesCharlie M Feeny, male   DOB: 05-21-1985, 31 y.o.   MRN: [id number] Patient ID: Logan ClinesCharlie M Bring, male   DOB: 05-21-1985, 31 y.o.   MRN: [id number] Patient ID: Logan ClinesCharlie M Najarian, male   DOB: 05-21-1985, 31 y.o.   MRN: [id number] Patient ID: Logan ClinesCharlie M Brimley, male   DOB: 05-21-1985, 31 y.o.   MRN: [id number] Patient ID: Logan ClinesCharlie M Romulus, male   DOB: 05-21-1985, 31 y.o.   MRN: [id number] Patient ID: Logan ClinesCharlie M Beeghly, male   DOB: 05-21-1985, 31 y.o.   MRN: [id number] Patient ID: Logan ClinesCharlie M Badertscher, male   DOB: 05-21-1985, 31 y.o.   MRN: [id number] Patient ID: Logan ClinesCharlie M Peerson, male   DOB: 05-21-1985, 31 y.o.   MRN: [id number] Patient ID: Logan ClinesCharlie M Solberg, male   DOB: 05-21-1985, 31 y.o.   MRN: [id number]   [facility] Outpatient Visit  Logan [name] 05-21-1985  Date: 12/06/2012   Subjective: This patient is a 31 year old single black male who lives with his mother  in [place]. He has completed the development disability program at [employer] and has nothing to do to occupy his time. The patient just saw Dora Simslan Watts PA in our [place] clinic about 2 weeks ago but the mother wanted to bring him here to establish care.  The patient has been autistic since a young age. Apparently he developed normally originally but began regressing around age 45 and lost his speech. He was always a very strong aggressive child. He's been in special education classes all his life. Unfortunately he's never been totally potty trained and still wears a diaper because he defecates in his pants. This is made it difficult to get them in any special needs program's or to get him workers. He also has problems with focus but has had a pretty good response to Adderall. He walks around around in a posturing position with both arms flexed and  pulling his fingers. He drools a lot. His younger brother recently became agitated and violent and the mother called social services to possibly get them both placed on the home. She is now reconsidering this and I suggested she look at other options such as getting more help in the home. Overall she thinks his medications are effective.  The patient and mom return after 3 months. The mother states that [name] has been generally stable. Every now and then he goes to a couple of nights where he doesn't sleep. She has to stay in the bedroom with them so he doesn't wander off. This is hard on her and I offered to increase the trazodone but she doesn't want any more medication increases. He is going to have more oral surgery soon. He still is total care as he is not potty trained. The mother states that when he turns 32 she definitely wants him in a facility as she "can't do this forever." He is not violent or agitated but she noticed he's developed a shoulder shrugging habit which may be a tic secondary to medication. However she is not worried about it and doesn't want anything changed Vitals:   07/05/16 0913  BP: (!) 134/94  Pulse: (!) 163    Mental Status Examination  Appearance: Casual, gesturingAnd drooling,Fairly calm today Alert: Yes Attention:  poor  Cooperative: Yes Eye  Contact absent Speech: Absent Psychomotor Activity: quiet today Memory/Concentration: Undetermined Oriented: Undetermined Mood: Euthymic Affect: Congruent Thought Processes and Associations: Undetermined Fund of Knowledge: Poor Thought Content: Undetermined Insight: Poor Judgement: Fair  Diagnosis: Autism, ADHD combined type  Treatment Plan: We will continue Adderall 30 mg daily ADHD, Zyprexa Zydis 20 mg 4 times daily for mood stabilization, and trazodone will 200 mg at bedtime for sleep I have given the mother clonazepam 2 mg which can be used if he can't sleep.Faith will return for a followup in 3  months  ROSS, [facility], MDPatient ID: Kashmir Lysaght Upham, male   DOB: 24-Mar-1985, 31 y.o.   MRN: [id number]

## 2016-07-17 ENCOUNTER — Other Ambulatory Visit (HOSPITAL_COMMUNITY): Payer: Self-pay | Admitting: Psychiatry

## 2016-07-25 DIAGNOSIS — H6123 Impacted cerumen, bilateral: Secondary | ICD-10-CM | POA: Diagnosis not present

## 2016-07-25 DIAGNOSIS — F840 Autistic disorder: Secondary | ICD-10-CM | POA: Diagnosis not present

## 2016-07-25 DIAGNOSIS — K5900 Constipation, unspecified: Secondary | ICD-10-CM | POA: Diagnosis not present

## 2016-07-25 DIAGNOSIS — L219 Seborrheic dermatitis, unspecified: Secondary | ICD-10-CM | POA: Diagnosis not present

## 2016-09-25 ENCOUNTER — Other Ambulatory Visit (HOSPITAL_COMMUNITY): Payer: Self-pay | Admitting: Psychiatry

## 2016-10-05 ENCOUNTER — Ambulatory Visit (INDEPENDENT_AMBULATORY_CARE_PROVIDER_SITE_OTHER): Payer: Medicare Other | Admitting: Psychiatry

## 2016-10-05 ENCOUNTER — Encounter (HOSPITAL_COMMUNITY): Payer: Self-pay | Admitting: Psychiatry

## 2016-10-05 VITALS — BP 146/98 | HR 146 | Ht 66.0 in | Wt 140.8 lb

## 2016-10-05 DIAGNOSIS — F840 Autistic disorder: Secondary | ICD-10-CM

## 2016-10-05 DIAGNOSIS — F39 Unspecified mood [affective] disorder: Secondary | ICD-10-CM | POA: Diagnosis not present

## 2016-10-05 DIAGNOSIS — F902 Attention-deficit hyperactivity disorder, combined type: Secondary | ICD-10-CM | POA: Diagnosis not present

## 2016-10-05 MED ORDER — AMPHETAMINE-DEXTROAMPHETAMINE 30 MG PO TABS: 30.0000 mg | ORAL_TABLET | ORAL | 0 refills | Status: DC

## 2016-10-05 MED ORDER — OLANZAPINE 20 MG PO TBDP
20.0000 mg | ORAL_TABLET | Freq: Four times a day (QID) | ORAL | 2 refills | Status: DC
Start: 2016-10-05 — End: 2017-01-04

## 2016-10-05 MED ORDER — TRAZODONE HCL 100 MG PO TABS: ORAL_TABLET | ORAL | 2 refills | Status: DC

## 2016-10-05 MED ORDER — CLONAZEPAM 2 MG PO TBDP: 2.0000 mg | ORAL_TABLET | Freq: Every day | ORAL | 2 refills | Status: DC

## 2016-10-05 NOTE — Progress Notes (Signed)
Patient ID: Delyle Weider Gemmer, male   DOB: 1985/02/19, 31 y.o.   MRN: [id number] Patient ID: Traxton Kolenda Goulart, male   DOB: [birth year], 31 y.o.   MRN: [id number] Patient ID: Jabarri Stefanelli Palma, male   DOB: 12/18/1985, 31 y.o.   MRN: [id number] Patient ID: Laquon Emel Rajewski, male   DOB: Nov 30, 1985, 31 y.o.   MRN: [id number] Patient ID: Jontavius Rabalais Rother, male   DOB: 04-17-1985, 31 y.o.   MRN: [id number] Patient ID: Winford Hehn Pinho, male   DOB: Jan 06, 1986, 31 y.o.   MRN: [id number] Patient ID: Jadarrius Maselli Selsor, male   DOB: [birth year], 31 y.o.   MRN: [id number] Patient ID: Dimitry Holsworth Racine, male   DOB: 03/10/1985, 31 y.o.   MRN: [id number] Patient ID: Katai Marsico Stryker, male   DOB: [birth year], 31 y.o.   MRN: [id number] Patient ID: Varick Keys Calvario, male   DOB: 11-08-1985, 31 y.o.   MRN: [id number] Patient ID: Damarko Stitely Klich, male   DOB: 06-30-1985, 31 y.o.   MRN: [id number]   [facility] Follow-up Outpatient Visit  Bryten Maher Suhre Mar 18, 1985  Date: 12/06/2012   Subjective: This patient is a 31 year old single black male who lives with his mother  in [place]. He has completed the development disability program at [employer] and has nothing to do to occupy his time. The patient just saw Dora Sims PA in our [place] clinic about 2 weeks ago but the mother wanted to bring him here to establish care.  The patient has been autistic since a young age. Apparently he developed normally originally but began regressing around age 84 and lost his speech. He was always a very strong aggressive child. He's been in special education classes all his life. Unfortunately he's never been totally potty trained and still wears a diaper because he defecates in his pants. This is made it difficult to get them in any special needs program's or to get him workers. He also has problems with focus but has had a pretty good response to Adderall. He walks around around in a posturing position with both arms flexed and  pulling his fingers. He drools a lot. His younger brother recently became agitated and violent and the mother called social services to possibly get them both placed on the home. She is now reconsidering this and I suggested she look at other options such as getting more help in the home. Overall she thinks his medications are effective.  The patient and mom return after 3 months. The mother states that Khali has been generally stable. For whatever reason he is sleeping better. Still gets very anxious about leaving the house since and has will try to run off. The mother states that she can only do this for another year because of her own health issues and neck years she's going to try to get him placed in a group home. He's under fairly good control but still is not toilet trained and this is a big obstacle for his participation in any programs. On the positive side he's not been violent or agitated. Vitals:   10/05/16 0955  BP: (!) 146/98  Pulse: (!) 146    Mental Status Examination  Appearance: Casual, gesturingAnd drooling,Fairly calm today Alert: Yes Attention:  poor  Cooperative: Yes Eye Contact absent Speech: Absent Psychomotor Activity: quiet today Memory/Concentration: Undetermined Oriented: Undetermined Mood: Euthymic Affect: Congruent Thought Processes and Associations: Undetermined Fund of Knowledge: Poor Thought Content: Undetermined Insight: Poor Judgement: Fair  Diagnosis: Autism, ADHD  combined type  Treatment Plan: We will continue Adderall 30 mg daily ADHD, Zyprexa Zydis 20 mg 4 times daily for mood stabilization, and trazodone will 200 mg at bedtime for sleep I have given the mother clonazepam 2 mg which can be used if he can't sleep.Jaishaun will return for a followup in 3 months  ROSS, [facility], MDPatient ID: Khadir Hagen Bendickson, male   DOB: [birth year], 31 y.o.   MRN: [id number]

## 2016-10-25 DIAGNOSIS — I10 Essential (primary) hypertension: Secondary | ICD-10-CM | POA: Diagnosis not present

## 2016-10-25 DIAGNOSIS — F840 Autistic disorder: Secondary | ICD-10-CM | POA: Diagnosis not present

## 2016-10-25 DIAGNOSIS — R000 Tachycardia, unspecified: Secondary | ICD-10-CM | POA: Diagnosis not present

## 2016-10-25 DIAGNOSIS — K0530 Chronic periodontitis, unspecified: Secondary | ICD-10-CM | POA: Diagnosis not present

## 2016-11-28 DIAGNOSIS — Z0000 Encounter for general adult medical examination without abnormal findings: Secondary | ICD-10-CM | POA: Diagnosis not present

## 2016-12-12 DIAGNOSIS — R000 Tachycardia, unspecified: Secondary | ICD-10-CM | POA: Diagnosis not present

## 2016-12-12 DIAGNOSIS — I10 Essential (primary) hypertension: Secondary | ICD-10-CM | POA: Diagnosis not present

## 2016-12-12 DIAGNOSIS — K0530 Chronic periodontitis, unspecified: Secondary | ICD-10-CM | POA: Diagnosis not present

## 2016-12-12 DIAGNOSIS — K5900 Constipation, unspecified: Secondary | ICD-10-CM | POA: Diagnosis not present

## 2017-01-03 DIAGNOSIS — I10 Essential (primary) hypertension: Secondary | ICD-10-CM | POA: Diagnosis not present

## 2017-01-03 DIAGNOSIS — F840 Autistic disorder: Secondary | ICD-10-CM | POA: Diagnosis not present

## 2017-01-03 DIAGNOSIS — Z01818 Encounter for other preprocedural examination: Secondary | ICD-10-CM | POA: Diagnosis not present

## 2017-01-03 DIAGNOSIS — F909 Attention-deficit hyperactivity disorder, unspecified type: Secondary | ICD-10-CM | POA: Diagnosis not present

## 2017-01-03 DIAGNOSIS — F79 Unspecified intellectual disabilities: Secondary | ICD-10-CM | POA: Diagnosis not present

## 2017-01-03 DIAGNOSIS — F418 Other specified anxiety disorders: Secondary | ICD-10-CM | POA: Diagnosis not present

## 2017-01-04 ENCOUNTER — Encounter (HOSPITAL_COMMUNITY): Payer: Self-pay | Admitting: Psychiatry

## 2017-01-04 ENCOUNTER — Ambulatory Visit (INDEPENDENT_AMBULATORY_CARE_PROVIDER_SITE_OTHER): Payer: Medicare Other | Admitting: Psychiatry

## 2017-01-04 VITALS — BP 136/88 | HR 127 | Ht 66.0 in | Wt 138.0 lb

## 2017-01-04 DIAGNOSIS — Z810 Family history of intellectual disabilities: Secondary | ICD-10-CM | POA: Diagnosis not present

## 2017-01-04 DIAGNOSIS — F39 Unspecified mood [affective] disorder: Secondary | ICD-10-CM | POA: Diagnosis not present

## 2017-01-04 DIAGNOSIS — F902 Attention-deficit hyperactivity disorder, combined type: Secondary | ICD-10-CM

## 2017-01-04 DIAGNOSIS — R451 Restlessness and agitation: Secondary | ICD-10-CM | POA: Diagnosis not present

## 2017-01-04 DIAGNOSIS — F840 Autistic disorder: Secondary | ICD-10-CM

## 2017-01-04 DIAGNOSIS — K5900 Constipation, unspecified: Secondary | ICD-10-CM

## 2017-01-04 MED ORDER — AMPHETAMINE-DEXTROAMPHETAMINE 30 MG PO TABS: 30.0000 mg | ORAL_TABLET | ORAL | 0 refills | Status: DC

## 2017-01-04 MED ORDER — CLONAZEPAM 2 MG PO TBDP: 2.0000 mg | ORAL_TABLET | Freq: Every day | ORAL | 2 refills | Status: DC

## 2017-01-04 MED ORDER — OLANZAPINE 20 MG PO TBDP: 20.0000 mg | ORAL_TABLET | Freq: Four times a day (QID) | ORAL | 2 refills | Status: DC

## 2017-01-04 MED ORDER — TRAZODONE HCL 100 MG PO TABS: ORAL_TABLET | ORAL | 2 refills | Status: DC

## 2017-01-04 NOTE — Progress Notes (Signed)
BH MD/PA/NP OP Progress Note  01/04/2017 11:15 AM Logan [name]  MRN:  [id number]  Chief Complaint:  Chief Complaint    Anxiety; Agitation; ADHD; Follow-up     HPI: This patient is a 31 year old single black male who lives with his mother  in [place]. He has completed the development disability program at [employer] and has nothing to do to occupy his time. The patient had been seen  in our [place] clinic but the mother wanted to bring him here to establish care.  The patient has been autistic since a young age. Apparently he developed normally originally but began regressing around age 89 and lost his speech. He was always a very strong aggressive child. He's been in special education classes all his life. Unfortunately he's never been totally potty trained and still wears a diaper because he defecates in his pants. This is made it difficult to get them in any special needs program's or to get him workers. He also has problems with focus but has had a pretty good response to Adderall. He walks around around in a posturing position with both arms flexed and pulling his fingers. He drools a lot. His younger brother recently became agitated and violent and the mother called social services to possibly get them both placed on the home. She is now reconsidering this and I suggested she look at other options such as getting more help in the home. Overall she thinks his medications are effective.  Patient and mother return after 3 months.  She states that nothing much is changed.  He is going to have to have oral surgery next month to get some teeth pulled.  He cannot brush his own teeth and he will only allow her to go so far and brushing his teeth.  His behavior is generally okay and she is able to manage it.  He has not been violent or aggressive.  He tends to pace around a lot but does respond to her direction eventually.  She still thinks his medications have been effective Visit  Diagnosis:    ICD-10-CM   1. Attention deficit hyperactivity disorder (ADHD), combined type F90.2   2. Autism F84.0   3. Episodic mood disorder (HCC) F39     Past Psychiatric History: Long-term outpatient treatment  Past Medical History:  Past Medical History:  Diagnosis Date  . ADHD (attention deficit hyperactivity disorder)   . Autism    History reviewed. No pertinent surgical history.  Family Psychiatric History: Brother is also autistic but has a history of being violent and aggressive.  He is currently in a group home  Family History:  Family History  Problem Relation Age of Onset  . Autism Brother     Social History:  Social History   Socioeconomic History  . Marital status: Single    Spouse name: None  . Number of children: None  . Years of education: None  . Highest education level: None  Social Needs  . Financial resource strain: None  . Food insecurity - worry: None  . Food insecurity - inability: None  . Transportation needs - medical: None  . Transportation needs - non-medical: None  Occupational History  . None  Tobacco Use  . Smoking status: Never Smoker  . Smokeless tobacco: Never Used  Substance and Sexual Activity  . Alcohol use: No  . Drug use: No  . Sexual activity: No  Other Topics Concern  . None  Social History Narrative  .  None    Allergies: No Known Allergies  Metabolic Disorder Labs: No results found for: HGBA1C, MPG No results found for: PROLACTIN No results found for: CHOL, TRIG, HDL, CHOLHDL, VLDL, LDLCALC No results found for: TSH  Therapeutic Level Labs: No results found for: LITHIUM No results found for: VALPROATE No components found for:  CBMZ  Current Medications: Current Outpatient Medications  Medication Sig Dispense Refill  . amphetamine-dextroamphetamine (ADDERALL) 30 MG tablet Take 1 tablet by mouth every morning. 30 tablet 0  . amphetamine-dextroamphetamine (ADDERALL) 30 MG tablet Take 1 tablet by mouth  every morning. 30 tablet 0  . amphetamine-dextroamphetamine (ADDERALL) 30 MG tablet Take 1 tablet by mouth every morning. 30 tablet 0  . clonazepam (KLONOPIN) 2 MG disintegrating tablet Take 1 tablet (2 mg total) by mouth at bedtime. 30 tablet 2  . polyethylene glycol (MIRALAX / GLYCOLAX) packet Take 17 g by mouth daily as needed for mild constipation. Reported on 05/28/2015    . traZODone (DESYREL) 100 MG tablet TAKE 2 TABLETS(200 MG) BY MOUTH AT BEDTIME 60 tablet 2  . cloNIDine (CATAPRES - DOSED IN MG/24 HR) 0.2 mg/24hr patch Place onto the skin.     No current facility-administered medications for this visit.      Musculoskeletal: Strength & Muscle Tone: within normal limits Gait & Station: normal Patient leans: N/A  Psychiatric Specialty Exam: Review of Systems  Gastrointestinal: Positive for constipation.  All other systems reviewed and are negative.   Blood pressure 136/88, pulse (!) 127, height 5\' 6"  (1.676 m), weight 138 lb (62.6 kg), SpO2 97 %.Body mass index is 22.27 kg/m.  General Appearance: Casual and Fairly Groomed  Eye Contact:  Absent  Speech:  No speech  Volume:  Only makes noises  Mood:  Anxious  Affect:  Flat  Thought Process:  NA  Orientation:  NA  Thought Content: NA   Suicidal Thoughts:  Unable to assess  Homicidal Thoughts:  Unable to assess  Memory:  Immediate;   NA Recent;   NA Remote;   NA  Judgement:  NA  Insight:  NA  Psychomotor Activity:  Increased and Restlessness  Concentration:  Concentration: Poor and Attention Span: Poor  Recall:  NA  Fund of Knowledge: NA  Language: NA  Akathisia:  No  Handed:  Right  AIMS (if indicated): not done  Assets:  Physical Health Resilience Social Support  ADL's:  Impaired  Cognition: Impaired,  Moderate  Sleep:  Fair   Screenings:   Assessment and Plan: This patient is a 31 year old autistic male with significant cognitive impairment agitated behavior and ADHD.  His mother does a good job taking  care of him but she is getting wary of it.  For now however he will continue trazodone 100 mg at bedtime for sleep, olanzapine Zydis 20 mg 4 times a day for agitation, Adderall 30 mg each morning for ADHD and clonazepam 2 mg disintegrating tablet to help him calm down in the evening.  He will return to see me in 3 months   Diannia Rudereborah Yeraldine Forney, MD 01/04/2017, 11:15 AM

## 2017-01-30 DIAGNOSIS — I10 Essential (primary) hypertension: Secondary | ICD-10-CM | POA: Diagnosis not present

## 2017-01-30 DIAGNOSIS — F79 Unspecified intellectual disabilities: Secondary | ICD-10-CM | POA: Diagnosis not present

## 2017-01-30 DIAGNOSIS — E785 Hyperlipidemia, unspecified: Secondary | ICD-10-CM | POA: Diagnosis not present

## 2017-01-30 DIAGNOSIS — F840 Autistic disorder: Secondary | ICD-10-CM | POA: Diagnosis not present

## 2017-01-30 DIAGNOSIS — F909 Attention-deficit hyperactivity disorder, unspecified type: Secondary | ICD-10-CM | POA: Diagnosis not present

## 2017-01-30 DIAGNOSIS — K029 Dental caries, unspecified: Secondary | ICD-10-CM | POA: Diagnosis not present

## 2017-03-13 DIAGNOSIS — R000 Tachycardia, unspecified: Secondary | ICD-10-CM | POA: Diagnosis not present

## 2017-03-13 DIAGNOSIS — F840 Autistic disorder: Secondary | ICD-10-CM | POA: Diagnosis not present

## 2017-03-13 DIAGNOSIS — K5900 Constipation, unspecified: Secondary | ICD-10-CM | POA: Diagnosis not present

## 2017-03-13 DIAGNOSIS — I10 Essential (primary) hypertension: Secondary | ICD-10-CM | POA: Diagnosis not present

## 2017-04-05 ENCOUNTER — Other Ambulatory Visit (HOSPITAL_COMMUNITY): Payer: Self-pay | Admitting: Psychiatry

## 2017-04-06 ENCOUNTER — Ambulatory Visit (HOSPITAL_COMMUNITY): Payer: Medicare Other | Admitting: Psychiatry

## 2017-04-07 ENCOUNTER — Other Ambulatory Visit (HOSPITAL_COMMUNITY): Payer: Self-pay | Admitting: Psychiatry

## 2017-04-12 ENCOUNTER — Ambulatory Visit (INDEPENDENT_AMBULATORY_CARE_PROVIDER_SITE_OTHER): Payer: Medicare Other | Admitting: Psychiatry

## 2017-04-12 ENCOUNTER — Encounter (HOSPITAL_COMMUNITY): Payer: Self-pay | Admitting: Psychiatry

## 2017-04-12 VITALS — BP 139/94 | HR 118 | Ht 66.0 in | Wt 136.0 lb

## 2017-04-12 DIAGNOSIS — F39 Unspecified mood [affective] disorder: Secondary | ICD-10-CM | POA: Diagnosis not present

## 2017-04-12 DIAGNOSIS — F840 Autistic disorder: Secondary | ICD-10-CM | POA: Diagnosis not present

## 2017-04-12 DIAGNOSIS — Z810 Family history of intellectual disabilities: Secondary | ICD-10-CM | POA: Diagnosis not present

## 2017-04-12 DIAGNOSIS — F902 Attention-deficit hyperactivity disorder, combined type: Secondary | ICD-10-CM | POA: Diagnosis not present

## 2017-04-12 MED ORDER — AMPHETAMINE-DEXTROAMPHETAMINE 30 MG PO TABS: 30.0000 mg | ORAL_TABLET | ORAL | 0 refills | Status: DC

## 2017-04-12 MED ORDER — CLONAZEPAM 2 MG PO TBDP: 2.0000 mg | ORAL_TABLET | Freq: Every day | ORAL | 2 refills | Status: DC

## 2017-04-12 MED ORDER — TRAZODONE HCL 100 MG PO TABS: ORAL_TABLET | ORAL | 2 refills | Status: DC

## 2017-04-12 MED ORDER — OLANZAPINE 20 MG PO TBDP
ORAL_TABLET | ORAL | 3 refills | Status: DC
Start: 2017-04-12 — End: 2017-07-12

## 2017-04-12 NOTE — Progress Notes (Signed)
BH MD/PA/NP OP Progress Note  04/12/2017 9:38 AM Logan [name]  MRN:  [id number]  Chief Complaint:  Chief Complaint    Anxiety; Agitation; ADHD; Follow-up     HPI: This patient is a 32 year old single black male who lives with his mother in [place]. He has completed the development disability program at [employer] and has nothing to do to occupy his time. The patient had been seen  in our [place] clinic but the mother wanted to bring him here to establish care.  The patient has been autistic since a young age. Apparently he developed normally originally but began regressing around age 46 and lost his speech. He was always a very strong aggressive child. He's been in special education classes all his life. Unfortunately he's never been totally potty trained and still wears a diaper because he defecates in his pants. This is made it difficult to get them in any special needs program's or to get him workers. He also has problems with focus but has had a pretty good response to Adderall. He walks around around in a posturing position with both arms flexed and pulling his fingers. He drools a lot. His younger brother recently became agitated and violent and the mother called social services to possibly get them both placed on the home. She is now reconsidering this and I suggested she look at other options such as getting more help in the home. Overall she thinks his medications are effective.  She and mom return after 3 months.  Since I last saw Logan [name] had some dental work done and a tooth filled.  He had to have lab work prior to this and everything looks normal.  He has been a little bit calmer lately although the mother is not sure why he gets more, why he gets more agitated.  She is grateful for the good times.  He is sleeping well at night and has not been violent or agitated he has lost a little bit more weight but she states that he eats well.  She still feels that his  medications have been helpful Visit Diagnosis:    ICD-10-CM   1. Attention deficit hyperactivity disorder (ADHD), combined type F90.2   2. Autism F84.0   3. Episodic mood disorder (HCC) F39     Past Psychiatric History: Long-term outpatient treatment  Past Medical History:  Past Medical History:  Diagnosis Date  . ADHD (attention deficit hyperactivity disorder)   . Autism    History reviewed. No pertinent surgical history.  Family Psychiatric History: Brother has a history of autistic and violent behavior as well  Family History:  Family History  Problem Relation Age of Onset  . Autism Brother     Social History:  Social History   Socioeconomic History  . Marital status: Single    Spouse name: None  . Number of children: None  . Years of education: None  . Highest education level: None  Social Needs  . Financial resource strain: None  . Food insecurity - worry: None  . Food insecurity - inability: None  . Transportation needs - medical: None  . Transportation needs - non-medical: None  Occupational History  . None  Tobacco Use  . Smoking status: Never Smoker  . Smokeless tobacco: Never Used  Substance and Sexual Activity  . Alcohol use: No  . Drug use: No  . Sexual activity: No  Other Topics Concern  . None  Social History Narrative  . None  Allergies: No Known Allergies  Metabolic Disorder Labs: No results found for: HGBA1C, MPG No results found for: PROLACTIN No results found for: CHOL, TRIG, HDL, CHOLHDL, VLDL, LDLCALC No results found for: TSH  Therapeutic Level Labs: No results found for: LITHIUM No results found for: VALPROATE No components found for:  CBMZ  Current Medications: Current Outpatient Medications  Medication Sig Dispense Refill  . amphetamine-dextroamphetamine (ADDERALL) 30 MG tablet Take 1 tablet by mouth every morning. 30 tablet 0  . amphetamine-dextroamphetamine (ADDERALL) 30 MG tablet Take 1 tablet by mouth every  morning. 30 tablet 0  . amphetamine-dextroamphetamine (ADDERALL) 30 MG tablet Take 1 tablet by mouth every morning. 30 tablet 0  . clonazepam (KLONOPIN) 2 MG disintegrating tablet Take 1 tablet (2 mg total) by mouth at bedtime. 30 tablet 2  . cloNIDine (CATAPRES - DOSED IN MG/24 HR) 0.2 mg/24hr patch Place onto the skin.    Marland Kitchen. OLANZapine zydis (ZYPREXA) 20 MG disintegrating tablet DISSOLVE 1 TABLET BY MOUTH FOUR TIMES DAILY 120 tablet 3  . polyethylene glycol (MIRALAX / GLYCOLAX) packet Take 17 g by mouth daily as needed for mild constipation. Reported on 05/28/2015    . traZODone (DESYREL) 100 MG tablet TAKE 2 TABLETS(200 MG) BY MOUTH AT BEDTIME 60 tablet 2   No current facility-administered medications for this visit.      Musculoskeletal: Strength & Muscle Tone: within normal limits Gait & Station: normal Patient leans: N/A  Psychiatric Specialty Exam: Review of Systems  All other systems reviewed and are negative.   Blood pressure (!) 139/94, pulse (!) 118, height 5\' 6"  (1.676 m), weight 136 lb (61.7 kg), SpO2 100 %.Body mass index is 21.95 kg/m.  General Appearance: Casual and Fairly Groomed  Eye Contact:  Minimal  Speech:  Unable to produce speech  Volume:  n/a  Mood:  Anxious  Affect:  Blunt  Thought Process:  NA  Orientation:  NA  Thought Content: NA   Suicidal Thoughts:  Unable to assess  Homicidal Thoughts:  Unable to assess  Memory:  NA  Judgement:  NA  Insight:  NA  Psychomotor Activity:  Restlessness  Concentration:  Concentration: Fair and Attention Span: Fair  Recall:  NA  Fund of Knowledge: NA  Language: None  Akathisia:  No  Handed:  Right  AIMS (if indicated): not done  Assets:  Physical Health Social Support  ADL's:  Impaired  Cognition: Impaired,  Severe  Sleep:  Good   Screenings:   Assessment and Plan: This patient is a 32 year old male with a history of severe autistic disorder inability to speak and cognitive delays.  He also has ADHD and  agitation.  He is manageable on his current level of medications.  He will continue Adderall 30 mg every morning for focus, clonazepam 2 mg disintegrating tablet at bedtime for sleep olanzapine disintegrating tablet 20 mg 4 times a day for agitation and trazodone 100 mg at bedtime for sleep.  He will return to see me in 3 months   Diannia Rudereborah Kyisha Fowle, MD 04/12/2017, 9:38 AM

## 2017-06-12 DIAGNOSIS — R634 Abnormal weight loss: Secondary | ICD-10-CM | POA: Diagnosis not present

## 2017-06-12 DIAGNOSIS — F840 Autistic disorder: Secondary | ICD-10-CM | POA: Diagnosis not present

## 2017-06-12 DIAGNOSIS — R000 Tachycardia, unspecified: Secondary | ICD-10-CM | POA: Diagnosis not present

## 2017-06-12 DIAGNOSIS — I10 Essential (primary) hypertension: Secondary | ICD-10-CM | POA: Diagnosis not present

## 2017-07-02 ENCOUNTER — Other Ambulatory Visit (HOSPITAL_COMMUNITY): Payer: Self-pay | Admitting: Psychiatry

## 2017-07-05 ENCOUNTER — Telehealth (HOSPITAL_COMMUNITY): Payer: Self-pay | Admitting: *Deleted

## 2017-07-05 ENCOUNTER — Other Ambulatory Visit (HOSPITAL_COMMUNITY): Payer: Self-pay | Admitting: Psychiatry

## 2017-07-05 MED ORDER — AMPHETAMINE-DEXTROAMPHETAMINE 30 MG PO TABS: 30.0000 mg | ORAL_TABLET | ORAL | 0 refills | Status: DC

## 2017-07-05 NOTE — Telephone Encounter (Signed)
Dr Tenny Craw  Patient's mom called requesting refill on Adderall. Next appointment is 07/12/17

## 2017-07-05 NOTE — Telephone Encounter (Signed)
sent 

## 2017-07-11 ENCOUNTER — Other Ambulatory Visit (HOSPITAL_COMMUNITY): Payer: Self-pay | Admitting: Psychiatry

## 2017-07-12 ENCOUNTER — Encounter (HOSPITAL_COMMUNITY): Payer: Self-pay | Admitting: Psychiatry

## 2017-07-12 ENCOUNTER — Ambulatory Visit (INDEPENDENT_AMBULATORY_CARE_PROVIDER_SITE_OTHER): Payer: Medicare Other | Admitting: Psychiatry

## 2017-07-12 VITALS — BP 143/91 | HR 118 | Ht 66.0 in | Wt 131.0 lb

## 2017-07-12 DIAGNOSIS — F840 Autistic disorder: Secondary | ICD-10-CM

## 2017-07-12 DIAGNOSIS — F902 Attention-deficit hyperactivity disorder, combined type: Secondary | ICD-10-CM

## 2017-07-12 DIAGNOSIS — Z810 Family history of intellectual disabilities: Secondary | ICD-10-CM

## 2017-07-12 MED ORDER — LORAZEPAM 1 MG PO TABS: ORAL_TABLET | ORAL | 0 refills | Status: DC

## 2017-07-12 MED ORDER — CLONAZEPAM 2 MG PO TBDP: ORAL_TABLET | ORAL | 2 refills | Status: DC

## 2017-07-12 MED ORDER — AMPHETAMINE-DEXTROAMPHETAMINE 30 MG PO TABS: 30.0000 mg | ORAL_TABLET | ORAL | 0 refills | Status: DC

## 2017-07-12 MED ORDER — TRAZODONE HCL 100 MG PO TABS: 200.0000 mg | ORAL_TABLET | Freq: Every day | ORAL | 2 refills | Status: DC

## 2017-07-12 MED ORDER — OLANZAPINE 20 MG PO TBDP: ORAL_TABLET | ORAL | 3 refills | Status: DC

## 2017-07-12 NOTE — Progress Notes (Signed)
BH MD/PA/NP OP Progress Note  07/12/2017 9:22 AM Logan [name]  MRN:  [id number]  Chief Complaint:  Chief Complaint    Anxiety; Agitation; ADHD     HPI:  This patient is a 32 year old single black male who lives with his mother in [place]. He has completed the development disability program at [employer] and has nothing to do to occupy his time. The patienthad been seenin our [facility] clinic but the mother wanted to bring him here to establish care.  The patient has been autistic since a young age. Apparently he developed normally originally but began regressing around age 45 and lost his speech. He was always a very strong aggressive child. He's been in special education classes all his life. Unfortunately he's never been totally potty trained and still wears a diaper because he defecates in his pants. This is made it difficult to get them in any special needs program's or to get him workers. He also has problems with focus but has had a pretty good response to Adderall. He walks around around in a posturing position with both arms flexed and pulling his fingers. He drools a lot. His younger brother recently became agitated and violent and the mother called social services to possibly get them both placed on the home. She is now reconsidering this and I suggested she look at other options such as getting more help in the home. Overall she thinks his medications are effective.  The patient and mom return after 3 months.  Logan [name] has lost another few pounds and is down to 131 pounds.  He has lost about 30 pounds in the last year.  The mother states that Logan [name] holds food in his mouth for hours and will not swallow it she is not sure why this is happening he cannot tell us.  He does not seem to have discomfort in his abdomen.  He is having regular bowel movements with the MiraLAX.  His primary doctor, Dr. Loleta Chance, has put him on boost and he is swallowing this.  I suggested may be a  referral to GI to look at his swallowing or possibly the need for endoscopy.  Overall he is been compliant and not agitated.  He is sleeping well at night. Visit Diagnosis:    ICD-10-CM   1. Autism F84.0   2. Attention deficit hyperactivity disorder (ADHD), combined type F90.2     Past Psychiatric History: Long-term outpatient treatment  Past Medical History:  Past Medical History:  Diagnosis Date  . ADHD (attention deficit hyperactivity disorder)   . Autism    History reviewed. No pertinent surgical history.  Family Psychiatric History: Older brother has a history of  autism and violent behavior  Family History:  Family History  Problem Relation Age of Onset  . Autism Brother     Social History:  Social History   Socioeconomic History  . Marital status: Single    Spouse name: Not on file  . Number of children: Not on file  . Years of education: Not on file  . Highest education level: Not on file  Occupational History  . Not on file  Social Needs  . Financial resource strain: Not on file  . Food insecurity:    Worry: Not on file    Inability: Not on file  . Transportation needs:    Medical: Not on file    Non-medical: Not on file  Tobacco Use  . Smoking status: Never Smoker  . Smokeless tobacco:  Never Used  Substance and Sexual Activity  . Alcohol use: No  . Drug use: No  . Sexual activity: Never  Lifestyle  . Physical activity:    Days per week: Not on file    Minutes per session: Not on file  . Stress: Not on file  Relationships  . Social connections:    Talks on phone: Not on file    Gets together: Not on file    Attends religious service: Not on file    Active member of club or organization: Not on file    Attends meetings of clubs or organizations: Not on file    Relationship status: Not on file  Other Topics Concern  . Not on file  Social History Narrative  . Not on file    Allergies: No Known Allergies  Metabolic Disorder Labs: No results  found for: HGBA1C, MPG No results found for: PROLACTIN No results found for: CHOL, TRIG, HDL, CHOLHDL, VLDL, LDLCALC No results found for: TSH  Therapeutic Level Labs: No results found for: LITHIUM No results found for: VALPROATE No components found for:  CBMZ  Current Medications: Current Outpatient Medications  Medication Sig Dispense Refill  . amphetamine-dextroamphetamine (ADDERALL) 30 MG tablet Take 1 tablet by mouth every morning. 30 tablet 0  . amphetamine-dextroamphetamine (ADDERALL) 30 MG tablet Take 1 tablet by mouth every morning. 30 tablet 0  . amphetamine-dextroamphetamine (ADDERALL) 30 MG tablet Take 1 tablet by mouth every morning. 30 tablet 0  . clonazepam (KLONOPIN) 2 MG disintegrating tablet DISSOLVE 1 TABLET(2 MG) ON THE TONGUE AT BEDTIME 30 tablet 2  . OLANZapine zydis (ZYPREXA) 20 MG disintegrating tablet DISSOLVE 1 TABLET BY MOUTH FOUR TIMES DAILY 120 tablet 3  . polyethylene glycol (MIRALAX / GLYCOLAX) packet Take 17 g by mouth daily as needed for mild constipation. Reported on 05/28/2015    . traZODone (DESYREL) 100 MG tablet Take 2 tablets (200 mg total) by mouth at bedtime. 60 tablet 2  . cloNIDine (CATAPRES - DOSED IN MG/24 HR) 0.1 mg/24hr patch APP 1 PATCH TO SKIN Q 7 DAYS FOR BP  2  . LORazepam (ATIVAN) 1 MG tablet Take one the night before procedure and one the am of dental procedure 2 tablet 0   No current facility-administered medications for this visit.      Musculoskeletal: Strength & Muscle Tone: within normal limits Gait & Station: normal Patient leans: N/A  Psychiatric Specialty Exam: Review of Systems  Unable to perform ROS: Patient nonverbal  Constitutional: Positive for weight loss.    Blood pressure (!) 143/91, pulse (!) 118, height  (1.676 m), weight 131 lb (59.4 kg), SpO2 98 %.Body mass index is 21.14 kg/m.  General Appearance: Casual and Fairly Groomed  Eye Contact:  None  Speech:  nonverbal  Volume:  nonverbal  Mood:   Euthymic  Affect:  Blunt and Flat  Thought Process:  NA  Orientation:  NA  Thought Content: NA   Suicidal Thoughts:  No  Homicidal Thoughts:  No  Memory:  NA  Judgement:  Impaired  Insight:  Lacking  Psychomotor Activity:  Mannerisms  Concentration:  Concentration: NA and Attention Span: NA  Recall:  NA  Fund of Knowledge: NA  Language: none  Akathisia:  No  Handed:  Right  AIMS (if indicated): not done  Assets:  Physical Health Resilience Social Support  ADL's:  Intact  Cognition: Impaired,  Severe  Sleep:  Good   Screenings:   Assessment and Plan: Patient is  is a 33 year old male with a history of autism cognitive disability agitation and ADHD.  I am quite concerned as his mom about his weight loss and his inability or refusal to swallow his food.  I agree with using the boost drinks but he may need more GI investigation.  Mom will look into this.  In the meantime he will continue Adderall 30 mg each morning for ADHD, clonazepam 2 mg at bedtime for sleep, olanzapine 20 mg 4 times a day for agitation and trazodone 200 mg at bedtime for sleep.  He will return to see me in 3 months   [name], MD 07/12/2017, 9:22 AM

## 2017-07-12 NOTE — Patient Instructions (Signed)
Ask Dr. Loleta Chance for referral to GI specialist to investigate swallowing

## 2017-08-07 DIAGNOSIS — R000 Tachycardia, unspecified: Secondary | ICD-10-CM | POA: Diagnosis not present

## 2017-08-07 DIAGNOSIS — R634 Abnormal weight loss: Secondary | ICD-10-CM | POA: Diagnosis not present

## 2017-08-07 DIAGNOSIS — F840 Autistic disorder: Secondary | ICD-10-CM | POA: Diagnosis not present

## 2017-08-07 DIAGNOSIS — I10 Essential (primary) hypertension: Secondary | ICD-10-CM | POA: Diagnosis not present

## 2017-08-30 ENCOUNTER — Other Ambulatory Visit (HOSPITAL_COMMUNITY): Payer: Self-pay | Admitting: Psychiatry

## 2017-09-01 ENCOUNTER — Telehealth (HOSPITAL_COMMUNITY): Payer: Self-pay

## 2017-09-01 ENCOUNTER — Other Ambulatory Visit (HOSPITAL_COMMUNITY): Payer: Self-pay | Admitting: Psychiatry

## 2017-09-01 MED ORDER — CLONAZEPAM 2 MG PO TBDP
ORAL_TABLET | ORAL | 2 refills | Status: DC
Start: 2017-09-01 — End: 2017-10-12

## 2017-09-01 NOTE — Telephone Encounter (Signed)
Called and spoke with patients mom informing her that the prescription has been sent to the pharmacy

## 2017-09-01 NOTE — Telephone Encounter (Signed)
sent 

## 2017-09-01 NOTE — Telephone Encounter (Signed)
Patient called requesting a refill on Clonazepam 2mg  tabs. Next appointment is scheduled for 10-12-17. Please advise

## 2017-10-09 DIAGNOSIS — I10 Essential (primary) hypertension: Secondary | ICD-10-CM | POA: Diagnosis not present

## 2017-10-09 DIAGNOSIS — Z6821 Body mass index (BMI) 21.0-21.9, adult: Secondary | ICD-10-CM | POA: Diagnosis not present

## 2017-10-09 DIAGNOSIS — F840 Autistic disorder: Secondary | ICD-10-CM | POA: Diagnosis not present

## 2017-10-09 DIAGNOSIS — R634 Abnormal weight loss: Secondary | ICD-10-CM | POA: Diagnosis not present

## 2017-10-12 ENCOUNTER — Ambulatory Visit (HOSPITAL_COMMUNITY): Payer: Self-pay | Admitting: Psychiatry

## 2017-10-12 ENCOUNTER — Encounter (HOSPITAL_COMMUNITY): Payer: Self-pay | Admitting: Psychiatry

## 2017-10-12 ENCOUNTER — Ambulatory Visit (INDEPENDENT_AMBULATORY_CARE_PROVIDER_SITE_OTHER): Payer: Medicare Other | Admitting: Psychiatry

## 2017-10-12 VITALS — BP 112/78 | HR 85 | Ht 66.0 in | Wt 129.0 lb

## 2017-10-12 DIAGNOSIS — F902 Attention-deficit hyperactivity disorder, combined type: Secondary | ICD-10-CM

## 2017-10-12 DIAGNOSIS — F840 Autistic disorder: Secondary | ICD-10-CM | POA: Diagnosis not present

## 2017-10-12 MED ORDER — AMPHETAMINE-DEXTROAMPHETAMINE 30 MG PO TABS: 30.0000 mg | ORAL_TABLET | ORAL | 0 refills | Status: DC

## 2017-10-12 MED ORDER — TRAZODONE HCL 100 MG PO TABS: 200.0000 mg | ORAL_TABLET | Freq: Every day | ORAL | 2 refills | Status: DC

## 2017-10-12 MED ORDER — LORAZEPAM 1 MG PO TABS: ORAL_TABLET | ORAL | 0 refills | Status: DC

## 2017-10-12 MED ORDER — OLANZAPINE 20 MG PO TBDP: ORAL_TABLET | ORAL | 3 refills | Status: DC

## 2017-10-12 MED ORDER — CLONAZEPAM 2 MG PO TBDP: ORAL_TABLET | ORAL | 2 refills | Status: DC

## 2017-10-12 NOTE — Progress Notes (Signed)
BH MD/PA/NP OP Progress Note  10/12/2017 1:37 PM Logan [name]  MRN:  [id number]  Chief Complaint:  Chief Complaint    Anxiety; Agitation; ADHD; Follow-up     HPI: This patient is a 32 year old single black male who lives with his mother in [place]. He has completed the development disability program at [employer] and has nothing to do to occupy his time. The patienthad been seenin our [facility] clinic but the mother wanted to bring him here to establish care.  The patient has been autistic since a young age. Apparently he developed normally originally but began regressing around age 89 and lost his speech. He was always a very strong aggressive child. He's been in special education classes all his life. Unfortunately he's never been totally potty trained and still wears a diaper because he defecates in his pants. This is made it difficult to get them in any special needs program's or to get him workers. He also has problems with focus but has had a pretty good response to Adderall. He walks around around in a posturing position with both arms flexed and pulling his fingers. He drools a lot. His younger brother recently became agitated and violent and the mother called social services to possibly get them both placed on the home. She is now reconsidering this and I suggested she look at other options such as getting more help in the home. Overall she thinks his medications are effective.  Patient and mom return after 3 months.  He is now getting boost supplementation 3 times a day.  This is causing the mom a lot of money but she is trying to do the best she can.  He has only lost 2 pounds since last visit.  They have not yet done the lab work because he has to be sedated for it and she is going to have to do it at [facility] when I clean his teeth.  Overall however his behavior has been good and he has been sleeping well at night.  He is having more bowel movements now but he  still does admit in his pull-up.  He has not been violent or agitated.  He continues to posture and drool and he said absolutely nothing today and made no noise Visit Diagnosis:    ICD-10-CM   1. Autism F84.0   2. Attention deficit hyperactivity disorder (ADHD), combined type F90.2     Past Psychiatric History: Long-term outpatient treatment  Past Medical History:  Past Medical History:  Diagnosis Date  . ADHD (attention deficit hyperactivity disorder)   . Autism    History reviewed. No pertinent surgical history.  Family Psychiatric History: See below  Family History:  Family History  Problem Relation Age of Onset  . Autism Brother     Social History:  Social History   Socioeconomic History  . Marital status: Single    Spouse name: Not on file  . Number of children: Not on file  . Years of education: Not on file  . Highest education level: Not on file  Occupational History  . Not on file  Social Needs  . Financial resource strain: Not on file  . Food insecurity:    Worry: Not on file    Inability: Not on file  . Transportation needs:    Medical: Not on file    Non-medical: Not on file  Tobacco Use  . Smoking status: Never Smoker  . Smokeless tobacco: Never Used  Substance and  Sexual Activity  . Alcohol use: No  . Drug use: No  . Sexual activity: Never  Lifestyle  . Physical activity:    Days per week: Not on file    Minutes per session: Not on file  . Stress: Not on file  Relationships  . Social connections:    Talks on phone: Not on file    Gets together: Not on file    Attends religious service: Not on file    Active member of club or organization: Not on file    Attends meetings of clubs or organizations: Not on file    Relationship status: Not on file  Other Topics Concern  . Not on file  Social History Narrative  . Not on file    Allergies: No Known Allergies  Metabolic Disorder Labs: No results found for: HGBA1C, MPG No results found  for: PROLACTIN No results found for: CHOL, TRIG, HDL, CHOLHDL, VLDL, LDLCALC No results found for: TSH  Therapeutic Level Labs: No results found for: LITHIUM No results found for: VALPROATE No components found for:  CBMZ  Current Medications: Current Outpatient Medications  Medication Sig Dispense Refill  . amphetamine-dextroamphetamine (ADDERALL) 30 MG tablet Take 1 tablet by mouth every morning. 30 tablet 0  . amphetamine-dextroamphetamine (ADDERALL) 30 MG tablet Take 1 tablet by mouth every morning. 30 tablet 0  . amphetamine-dextroamphetamine (ADDERALL) 30 MG tablet Take 1 tablet by mouth every morning. 30 tablet 0  . clonazepam (KLONOPIN) 2 MG disintegrating tablet DISSOLVE 1 TABLET(2 MG) ON THE TONGUE AT BEDTIME 30 tablet 2  . cloNIDine (CATAPRES - DOSED IN MG/24 HR) 0.1 mg/24hr patch APP 1 PATCH TO SKIN Q 7 DAYS FOR BP  2  . LORazepam (ATIVAN) 1 MG tablet Take one the night before procedure and one the am of dental procedure 2 tablet 0  . OLANZapine zydis (ZYPREXA) 20 MG disintegrating tablet DISSOLVE 1 TABLET BY MOUTH FOUR TIMES DAILY 120 tablet 3  . polyethylene glycol (MIRALAX / GLYCOLAX) packet Take 17 g by mouth daily as needed for mild constipation. Reported on 05/28/2015    . traZODone (DESYREL) 100 MG tablet Take 2 tablets (200 mg total) by mouth at bedtime. 60 tablet 2   No current facility-administered medications for this visit.      Musculoskeletal: Strength & Muscle Tone: within normal limits Gait & Station: normal Patient leans: N/A  Psychiatric Specialty Exam: Review of Systems  Gastrointestinal: Positive for constipation.  All other systems reviewed and are negative.   Blood pressure 112/78, pulse 85, height 5\' 6"  (1.676 m), weight 129 lb (58.5 kg), SpO2 98 %.Body mass index is 20.82 kg/m.  General Appearance: Casual and Fairly Groomed  Eye Contact:  None  Speech:  none  Volume: n/a  Mood:  unable to asses  Affect:  Blunt and Flat  Thought Process:   NA  Orientation:  NA  Thought Content: NA   Suicidal Thoughts:  No  Homicidal Thoughts:  No  Memory:  NA  Judgement:  Impaired  Insight:  NA  Psychomotor Activity:  Decreased  Concentration:  Concentration: Poor and Attention Span: Poor  Recall:  NA  Fund of Knowledge: NA  Language: Poor  Akathisia:  No  Handed:  Right  AIMS (if indicated): not done  Assets:  Physical Health Social Support  ADL's:  Impaired  Cognition: Impaired,  Severe  Sleep:  Good   Screenings:   Assessment and Plan: This patient is a 32 year old male with a history of  autism cognitive impairment ADHD.  He is doing fairly well on his current regimen.  His weight loss is still not explained but hopefully he will get his labs done in the near future.  In the meantime he is losing weight much more slowly now that he is on supplements.  He will continue trazodone 200 mill grams at bedtime for sleep, olanzapine Zydis 20 mg 4 times daily for agitation, clonazepam 2 mg at bedtime and Adderall 30 mg twice daily for ADHD.  Return to see me in 3 months   [name], MD 10/12/2017, 1:37 PM

## 2017-10-18 ENCOUNTER — Other Ambulatory Visit (HOSPITAL_COMMUNITY): Payer: Self-pay | Admitting: Psychiatry

## 2017-10-18 ENCOUNTER — Telehealth (HOSPITAL_COMMUNITY): Payer: Self-pay | Admitting: *Deleted

## 2017-10-18 MED ORDER — AMPHETAMINE-DEXTROAMPHETAMINE 30 MG PO TABS: 30.0000 mg | ORAL_TABLET | ORAL | 0 refills | Status: DC

## 2017-10-18 NOTE — Telephone Encounter (Signed)
Dr Tenny Craw Patient's Mom called & Jordan Hawks has the Adderall . Requesting new script to be sent.  Updated Rx in system

## 2017-10-18 NOTE — Telephone Encounter (Signed)
sent 

## 2017-12-14 ENCOUNTER — Other Ambulatory Visit (HOSPITAL_COMMUNITY): Payer: Self-pay | Admitting: Psychiatry

## 2018-01-10 DIAGNOSIS — Z6821 Body mass index (BMI) 21.0-21.9, adult: Secondary | ICD-10-CM | POA: Diagnosis not present

## 2018-01-10 DIAGNOSIS — K5900 Constipation, unspecified: Secondary | ICD-10-CM | POA: Diagnosis not present

## 2018-01-10 DIAGNOSIS — F72 Severe intellectual disabilities: Secondary | ICD-10-CM | POA: Diagnosis not present

## 2018-01-10 DIAGNOSIS — I10 Essential (primary) hypertension: Secondary | ICD-10-CM | POA: Diagnosis not present

## 2018-01-15 ENCOUNTER — Ambulatory Visit (INDEPENDENT_AMBULATORY_CARE_PROVIDER_SITE_OTHER): Payer: Medicare Other | Admitting: Psychiatry

## 2018-01-15 ENCOUNTER — Other Ambulatory Visit: Payer: Self-pay

## 2018-01-15 ENCOUNTER — Other Ambulatory Visit (HOSPITAL_COMMUNITY): Payer: Self-pay | Admitting: Psychiatry

## 2018-01-15 ENCOUNTER — Encounter (HOSPITAL_COMMUNITY): Payer: Self-pay | Admitting: Psychiatry

## 2018-01-15 ENCOUNTER — Emergency Department (HOSPITAL_COMMUNITY)
Admission: EM | Admit: 2018-01-15 | Discharge: 2018-01-15 | Disposition: A | Discharge status: Home / Self Care | Payer: Medicare Other | Attending: Emergency Medicine | Admitting: Emergency Medicine

## 2018-01-15 VITALS — BP 115/82 | HR 78 | Ht 66.0 in | Wt 125.0 lb

## 2018-01-15 DIAGNOSIS — F902 Attention-deficit hyperactivity disorder, combined type: Secondary | ICD-10-CM | POA: Diagnosis not present

## 2018-01-15 DIAGNOSIS — K5900 Constipation, unspecified: Secondary | ICD-10-CM | POA: Diagnosis present

## 2018-01-15 DIAGNOSIS — F840 Autistic disorder: Secondary | ICD-10-CM | POA: Diagnosis not present

## 2018-01-15 DIAGNOSIS — K5641 Fecal impaction: Secondary | ICD-10-CM | POA: Diagnosis not present

## 2018-01-15 LAB — COMPREHENSIVE METABOLIC PANEL
ALT: 15 U/L (ref 0–44)
ANION GAP: 9 (ref 5–15)
AST: 21 U/L (ref 15–41)
Albumin: 4.4 g/dL (ref 3.5–5.0)
Alkaline Phosphatase: 77 U/L (ref 38–126)
BUN: 10 mg/dL (ref 6–20)
CO2: 23 mmol/L (ref 22–32)
Calcium: 9.2 mg/dL (ref 8.9–10.3)
Chloride: 107 mmol/L (ref 98–111)
Creatinine, Ser: 0.81 mg/dL (ref 0.61–1.24)
GFR calc Af Amer: >60 mL/min (ref >60)
GFR calc non Af Amer: >60 mL/min (ref >60)
Glucose, Bld: 87 mg/dL (ref 70–99)
Potassium: 3.4 mmol/L — ABNORMAL LOW (ref 3.5–5.1)
Sodium: 139 mmol/L (ref 135–145)
Total Bilirubin: 0.6 mg/dL (ref 0.3–1.2)
Total Protein: 7.4 g/dL (ref 6.5–8.1)

## 2018-01-15 LAB — CBC WITH DIFFERENTIAL/PLATELET
Abs Immature Granulocytes: 0.01 10*3/uL (ref 0.00–0.07)
BASOS ABS: 0 10*3/uL (ref 0.0–0.1)
Basophils Relative: 0 %
Eosinophils Absolute: 0.1 10*3/uL (ref 0.0–0.5)
Eosinophils Relative: 1 %
HCT: 43.1 % (ref 39.0–52.0)
Hemoglobin: 13.7 g/dL (ref 13.0–17.0)
Immature Granulocytes: 0 %
Lymphocytes Relative: 31 %
Lymphs Abs: 1.7 10*3/uL (ref 0.7–4.0)
MCH: 28.4 pg (ref 26.0–34.0)
MCHC: 31.8 g/dL (ref 30.0–36.0)
MCV: 89.4 fL (ref 80.0–100.0)
Monocytes Absolute: 0.5 10*3/uL (ref 0.1–1.0)
Monocytes Relative: 9 %
NRBC: 0 % (ref 0.0–0.2)
Neutro Abs: 3.2 10*3/uL (ref 1.7–7.7)
Neutrophils Relative %: 59 %
PLATELETS: 227 10*3/uL (ref 150–400)
RBC: 4.82 MIL/uL (ref 4.22–5.81)
RDW: 12.7 % (ref 11.5–15.5)
WBC: 5.5 10*3/uL (ref 4.0–10.5)

## 2018-01-15 MED ORDER — AMPHETAMINE-DEXTROAMPHETAMINE 30 MG PO TABS: 30.0000 mg | ORAL_TABLET | ORAL | 0 refills | Status: DC

## 2018-01-15 MED ORDER — MAGNESIUM CITRATE PO SOLN: 1.0000 | Freq: Once | ORAL | 5 refills | Status: AC

## 2018-01-15 MED ORDER — KETAMINE HCL 50 MG/ML IJ SOLN
4.0000 mg/kg | Freq: Once | INTRAMUSCULAR | Status: AC
  Administered 2018-01-15: 225 mg via INTRAMUSCULAR
  Filled 2018-01-15: qty 10

## 2018-01-15 MED ORDER — ONDANSETRON HCL 4 MG/2ML IJ SOLN
INTRAMUSCULAR | Status: AC
  Administered 2018-01-15: 15:00:00
  Filled 2018-01-15: qty 2

## 2018-01-15 MED ORDER — LACTATED RINGERS IV BOLUS
1000.0000 mL | Freq: Once | INTRAVENOUS | Status: AC
  Administered 2018-01-15: 1000 mL via INTRAVENOUS

## 2018-01-15 MED ORDER — TRAZODONE HCL 100 MG PO TABS: 200.0000 mg | ORAL_TABLET | Freq: Every day | ORAL | 2 refills | Status: DC

## 2018-01-15 MED ORDER — DOCUSATE SODIUM 100 MG PO CAPS: 100.0000 mg | ORAL_CAPSULE | Freq: Two times a day (BID) | ORAL | 1 refills | Status: DC

## 2018-01-15 MED ORDER — POLYETHYLENE GLYCOL 3350 17 G PO PACK: 17.0000 g | PACK | Freq: Every day | ORAL | 0 refills | Status: DC

## 2018-01-15 MED ORDER — OLANZAPINE 20 MG PO TBDP: ORAL_TABLET | ORAL | 3 refills | Status: DC

## 2018-01-15 MED ORDER — CLONAZEPAM 2 MG PO TBDP: ORAL_TABLET | ORAL | 2 refills | Status: DC

## 2018-01-15 NOTE — Discharge Instructions (Addendum)

## 2018-01-15 NOTE — Sedation Documentation (Signed)
Vital signs stable. 

## 2018-01-15 NOTE — ED Triage Notes (Signed)
Pt is special needs, Per mother pt has not has good BM for 3 weeks, uses miralax   and Linzess with out relief.  Per mother,pt has hard stool in rectum, she was not able to get it out.

## 2018-01-15 NOTE — ED Notes (Signed)
ED Provider at bedside. 

## 2018-01-15 NOTE — Progress Notes (Signed)
BH MD/PA/NP OP Progress Note  01/15/2018 10:05 AM Logan [name]  MRN:  [id number]  Chief Complaint:  Chief Complaint    Agitation     HPI: This patient is a 32 year old single black male who lives with his mother in [place]. He has completed the development disability program at [employer] and has nothing to do to occupy his time. The [facility] but the mother wanted to bring him here to establish care.  The patient has been autistic since a young age. Apparently he developed normally originally but began regressing around age 89 and lost his speech. He was always a very strong aggressive child. He's been in special education classes all his life. Unfortunately he's never been totally potty trained and still wears a diaper because he defecates in his pants. This is made it difficult to get them in any special needs program's or to get him workers. He also has problems with focus but has had a pretty good response to Adderall. He walks around around in a posturing position with both arms flexed and pulling his fingers. He drools a lot. His younger brother recently became agitated and violent and the mother called social services to possibly get them both placed on the home. She is now reconsidering this and I suggested she look at other options such as getting more help in the home. Overall she thinks his medications are effective.  The patient and mom return after 3 months.  She states that the patient is still doing well and he is calm at home.  He is sleeping well and staying focused as much as can be expected given his disability.  He is still losing weight and he has lost another 4 pounds.  He is down to 125 pounds.  He is lost about 13 pounds in the past year in the previous year he lost about 20 pounds.  She states that it is difficult to get any lab work done because he has to be put to sleep.  He will let Dr. Loleta ChanceHill, his primary doctor even  examine him.  His last labs were drawn during [facility] and everything was normal including CBC and conference of metabolic panel and total protein.  She states that he eats but may be not as much as he used to.  I am concerned about the weight loss and so was the mother but there does not seem to be much more we can do as he is drinking boost 3 times a day.  She states that he gets under 120 pounds she is going to try to get them checked into the hospital for work-up.  This is been difficult because he gets very agitated around hospital personnel. Visit Diagnosis:    ICD-10-CM   1. Autism F84.0   2. Attention deficit hyperactivity disorder (ADHD), combined type F90.2     Past Psychiatric History: Long-term outpatient treatment  Past Medical History:  Past Medical History:  Diagnosis Date  . ADHD (attention deficit hyperactivity disorder)   . Autism    History reviewed. No pertinent surgical history.  Family Psychiatric History: See below  Family History:  Family History  Problem Relation Age of Onset  . Autism Brother     Social History:  Social History   Socioeconomic History  . Marital status: Single    Spouse name: Not on file  . Number of children: Not on file  . Years of  education: Not on file  . Highest education level: Not on file  Occupational History  . Not on file  Social Needs  . Financial resource strain: Not on file  . Food insecurity:    Worry: Not on file    Inability: Not on file  . Transportation needs:    Medical: Not on file    Non-medical: Not on file  Tobacco Use  . Smoking status: Never Smoker  . Smokeless tobacco: Never Used  Substance and Sexual Activity  . Alcohol use: No  . Drug use: No  . Sexual activity: Never  Lifestyle  . Physical activity:    Days per week: Not on file    Minutes per session: Not on file  . Stress: Not on file  Relationships  . Social connections:    Talks on phone: Not on file    Gets  together: Not on file    Attends religious service: Not on file    Active member of club or organization: Not on file    Attends meetings of clubs or organizations: Not on file    Relationship status: Not on file  Other Topics Concern  . Not on file  Social History Narrative  . Not on file    Allergies: No Known Allergies  Metabolic Disorder Labs: No results found for: HGBA1C, MPG No results found for: PROLACTIN No results found for: CHOL, TRIG, HDL, CHOLHDL, VLDL, LDLCALC No results found for: TSH  Therapeutic Level Labs: No results found for: LITHIUM No results found for: VALPROATE No components found for:  CBMZ  Current Medications: Current Outpatient Medications  Medication Sig Dispense Refill  . amphetamine-dextroamphetamine (ADDERALL) 30 MG tablet Take 1 tablet by mouth every morning. 30 tablet 0  . amphetamine-dextroamphetamine (ADDERALL) 30 MG tablet Take 1 tablet by mouth every morning. 30 tablet 0  . amphetamine-dextroamphetamine (ADDERALL) 30 MG tablet Take 1 tablet by mouth every morning. 30 tablet 0  . clonazepam (KLONOPIN) 2 MG disintegrating tablet DISSOLVE 1 TABLET(2 MG) ON THE TONGUE AT BEDTIME 30 tablet 2  . cloNIDine (CATAPRES - DOSED IN MG/24 HR) 0.1 mg/24hr patch APP 1 PATCH TO SKIN Q 7 DAYS FOR BP  2  . LORazepam (ATIVAN) 1 MG tablet Take one the night before procedure and one the am of dental procedure 2 tablet 0  . OLANZapine zydis (ZYPREXA) 20 MG disintegrating tablet DISSOLVE 1 TABLET BY MOUTH FOUR TIMES DAILY 120 tablet 3  . polyethylene glycol (MIRALAX / GLYCOLAX) packet Take 17 g by mouth daily as needed for mild constipation. Reported on 05/28/2015    . traZODone (DESYREL) 100 MG tablet Take 2 tablets (200 mg total) by mouth at bedtime. 60 tablet 2   No current facility-administered medications for this visit.      Musculoskeletal: Strength & Muscle Tone: within normal limits Gait & Station: normal Patient leans: N/A  Psychiatric Specialty  Exam: ROS  Blood pressure 115/82, pulse 78, height 5\' 6"  (1.676 m), weight 125 lb (56.7 kg), SpO2 95 %.Body mass index is 20.18 kg/m.  General Appearance: Casual and Fairly Groomed  Eye Contact:  None  Speech:  NA  Volume:  No speech  Mood:  NA  Affect:  Flat  Thought Process:  NA  Orientation:  NA  Thought Content: NA   Suicidal Thoughts:  No  Homicidal Thoughts:  No  Memory:  NA  Judgement:  NA  Insight:  NA  Psychomotor Activity:  Decreased  Concentration:  Concentration: Fair and  Attention Span: Fair  Recall:  NA  Fund of Knowledge: Poor  Language: NA  Akathisia:  No  Handed:  Right  AIMS (if indicated): not done  Assets:  Physical Health Resilience Social Support  ADL's:  Impaired  Cognition: Impaired,  Severe  Sleep:  Good   Screenings:   Assessment and Plan: Patient is a 32 year old male with a history of moderate to severe intellectual disability, autism ADHD and mood swings.  I am concerned about his weight loss and the mother is going to try to readdress this with his primary doctor.  Adderall can diminished appetite but olanzapine tends to increase it.  She notes however that he is continuing to eat but still losing weight.  For now he will continue trazodone 200 mg at bedtime, olanzapine Zydis he milligrams 4 times daily for mood swings and agitation, Ativan 1 mg at bedtime for sleep , Adderall 10 mg every morning for ADHD.  He will return to see me in 3 months   [name], MD 01/15/2018, 10:05 AM

## 2018-01-15 NOTE — ED Provider Notes (Signed)
Emergency Department Provider Note   I have reviewed the triage vital signs and the nursing notes.   HISTORY  Chief Complaint Constipation   HPI Logan [name] is a 32 y.o. male with history of very severe autism who presents the emergency department today for constipation.  Patient is mute and apparently is very violent when in medical situations.  The mother who is his sole caregiver states he has not had a bowel movement since last month on time.  Spent at least 3 weeks.  She said that when try to get him to have a bowel movement she notes it was a hard ball of poop in her his distal rectum but she could not get it out but a little bit at a time.  Still not having bowel movements.  Start on Linzess a few days ago which did not seem to help.  Patient had decreased appetite in the last 3 weeks that she thinks is related to this.  No fevers, vomiting or abdominal distention. No other associated or modifying symptoms.    Past Medical History:  Diagnosis Date  . ADHD (attention deficit hyperactivity disorder)   . Autism     Patient Active Problem List   Diagnosis Date Noted  . Autistic disorder, current or active state 05/31/2011  . Attention deficit disorder with hyperactivity(314.01) 05/31/2011    No past surgical history on file.  Current Outpatient Rx  . Order #: [id number] Class: Normal  . Order #: [id number] Class: Historical Med  . Order #: [id number] Class: Normal  . Order #: [id number] Class: Historical Med  . Order #: [id number] Class: Historical Med  . Order #: [id number] Class: Normal  . Order #: [id number] Class: Historical Med  . Order #: [id number] Class: Normal  . Order #: [id number] Class: Normal  . Order #: [id number] Class: Normal  . Order #: [id number] Class: Print  . Order #: [id number] Class: Normal  . Order #: [id number] Class: Print  . Order #: [id number] Class: Print    Allergies Patient has no known allergies.  Family History  Problem Relation Age of Onset  .  Autism Brother     Social History Social History   Tobacco Use  . Smoking status: Never Smoker  . Smokeless tobacco: Never Used  Substance Use Topics  . Alcohol use: No  . Drug use: No    Review of Systems  All other systems negative except as documented in the HPI. All pertinent positives and negatives as reviewed in the HPI. ____________________________________________   PHYSICAL EXAM:  VITAL SIGNS: ED Triage Vitals  Enc Vitals Group     BP 01/15/18 1032 103/69     Pulse Rate 01/15/18 1032 91     Resp 01/15/18 1032 18     Temp 01/15/18 1032 97.9 F (36.6 C)     Temp Source 01/15/18 1032 Oral     SpO2 01/15/18 1032 100 %     Weight 01/15/18 1033 125 lb (56.7 kg)     Height --      Head Circumference --      Peak Flow --      Pain Score 01/15/18 1033 0     Pain Loc --      Pain Edu? --      Excl. in GC? --     Constitutional: Alert and appropriate per baseline. Well appearing and in no acute distress. Eyes: Conjunctivae are normal. PERRL. EOMI. Head: Atraumatic. Nose: No congestion/rhinnorhea. Mouth/Throat: Mucous membranes are moist.  Oropharynx non-erythematous. Neck: No stridor.  No meningeal signs.  Cardiovascular: Normal rate, regular rhythm. Good peripheral circulation. Grossly normal heart sounds.   Respiratory: Normal respiratory effort.  No retractions. Lungs CTAB. Gastrointestinal: Soft and nontender. No distention.  Musculoskeletal: No lower extremity tenderness nor edema. No gross deformities of extremities. Neurologic:  Normal speech and language. No gross focal neurologic deficits are appreciated.  Skin:  Skin is warm, dry and intact. No rash noted.   ____________________________________________   LABS (all labs ordered are listed, but only abnormal results are displayed)  Labs Reviewed  COMPREHENSIVE METABOLIC PANEL - Abnormal; Notable for the following components:      Result Value   Potassium 3.4 (*)    All other components within  normal limits  CBC WITH DIFFERENTIAL/PLATELET   ____________________________________________  PROCEDURES  Procedure(s) performed:   .Sedation Date/Time: 01/15/2018 7:42 PM Performed by: Marily Memos, MD Authorized by: Marily Memos, MD   Consent:    Consent obtained:  Verbal   Consent given by:  Patient   Risks discussed:  Allergic reaction, dysrhythmia, inadequate sedation, nausea, prolonged hypoxia resulting in organ damage, prolonged sedation necessitating reversal, respiratory compromise necessitating ventilatory assistance and intubation and vomiting   Alternatives discussed:  Analgesia without sedation, anxiolysis and regional anesthesia Universal protocol:    Procedure explained and questions answered to patient or proxy's satisfaction: yes     Relevant documents present and verified: yes     Test results available and properly labeled: yes     Imaging studies available: yes     Required blood products, implants, devices, and special equipment available: yes     Site/side marked: yes     Immediately prior to procedure a time out was called: yes     Patient identity confirmation method:  Arm band, provided demographic data, hospital-assigned identification number and verbally with patient (mother) Indications:    Sedation is required to allow for: disimpaction.   Procedure necessitating sedation performed by:  Physician performing sedation Pre-sedation assessment:    Time since last food or drink:  2 hours   ASA classification: class 2 - patient with mild systemic disease     Neck mobility: normal     Mouth opening:  3 or more finger widths   Thyromental distance:  4 finger widths   Mallampati score:  I - soft palate, uvula, fauces, pillars visible   Pre-sedation assessments completed and reviewed: airway patency, cardiovascular function, hydration status, mental status, nausea/vomiting, pain level, respiratory function and temperature   Immediate pre-procedure details:      Reassessment: Patient reassessed immediately prior to procedure     Reviewed: vital signs, relevant labs/tests and NPO status     Verified: bag valve mask available, emergency equipment available, intubation equipment available, IV patency confirmed, oxygen available and suction available   Procedure details (see MAR for exact dosages):    Preoxygenation:  Nasal cannula   Sedation:  Ketamine   Intra-procedure monitoring:  Blood pressure monitoring, cardiac monitor, continuous pulse oximetry, frequent LOC assessments, frequent vital sign checks and continuous capnometry   Intra-procedure events: none     Total Provider sedation time (minutes):  35 Post-procedure details:    Post-sedation assessment completed:  01/15/2018 7:43 PM   Attendance: Constant attendance by certified staff until patient recovered     Recovery: Patient returned to pre-procedure baseline     Post-sedation assessments completed and reviewed: airway patency, cardiovascular function, hydration status, mental status, nausea/vomiting, pain level, respiratory function and temperature     Patient is stable for discharge or admission:  yes     Patient tolerance:  Tolerated well, no immediate complications Fecal disimpaction Date/Time: 01/15/2018 7:43 PM Performed by: Marily MemosMesner, Logan Strawder, MD Authorized by: Marily MemosMesner, Ceana Fiala, MD  Consent: Verbal consent obtained. Risks and benefits: risks, benefits and alternatives were discussed Consent given by: parent Patient understanding: patient states understanding of the procedure being performed Patient consent: the patient's understanding of the procedure matches consent given Procedure consent: procedure consent matches procedure scheduled Relevant documents: relevant documents present and verified Test results: test results available and properly labeled Required items: required blood products, implants, devices, and special equipment available Patient identity confirmed: arm band and  provided demographic data Time out: Immediately prior to procedure a "time out" was called to verify the correct patient, procedure, equipment, support staff and site/side marked as required. Preparation: Patient was prepped and draped in the usual sterile fashion. Local anesthesia used: no  Anesthesia: Local anesthesia used: no  Sedation: Patient sedated: yes Sedation: for details, see sedation procedure note. Vitals: Vital signs were monitored during sedation.  Patient tolerance: Patient tolerated the procedure well with no immediate complications      ____________________________________________   INITIAL IMPRESSION / ASSESSMENT AND PLAN / ED COURSE  Per mother report patient will not tolerate anything beyond the basic medical exam without sedation.  As he has reportedly not a bowel movement in 3 weeks even though he has been taking MiraLAX and has been having decreased appetite at that time as well feel like it is necessary to sedate him to do a rectal exam to make sure is not impacted as this could lead to life-threatening event.  I did discuss with the mother that I would sedate him one time and do everything I could until he woke up however if this did not work he would likely need to see GI or surgery so that they could provide full anesthesia as this is beyond the scope of procedural sedation in the emergency room.  Amount of hard stool disimpacted from the patient's rectum.  Still had some stool retention but was much softer and higher up which was impossible to reach.  Patient had a prolonged recovery from sedation likely secondary to the dose used however was awake and at baseline prior to discharge.  Has some vomiting but had not vomited within the last 2 hours.  Discussed again with mom that it know for sure how well this was going to work and he may need a more extensive disimpaction that I cannot do in the emergency room however will try magnesium citrate MiraLAX at home  with outpatient follow-up as needed.     Pertinent labs & imaging results that were available during my care of the patient were reviewed by me and considered in my medical decision making (see chart for details).  ____________________________________________  FINAL CLINICAL IMPRESSION(S) / ED DIAGNOSES  Final diagnoses:  Fecal impaction in rectum [facility](HCC)     MEDICATIONS GIVEN DURING THIS VISIT:  Medications  ketamine (KETALAR) injection 225 mg (225 mg Intramuscular Given 01/15/18 1335)  lactated ringers bolus 1,000 mL (0 mLs Intravenous Stopped 01/15/18 1423)  ondansetron (ZOFRAN) 4 MG/2ML injection (  Given 01/15/18 1443)     NEW OUTPATIENT MEDICATIONS STARTED DURING THIS VISIT:  Discharge Medication List as of 01/15/2018  7:39 PM    START taking these medications   Details  docusate sodium (COLACE) 100 MG capsule Take 1 capsule (100 mg total) by mouth every 12 (twelve) hours., Starting Mon 01/15/2018, Print  magnesium citrate SOLN Take 296 mLs (1 Bottle total) by mouth once for 1 dose., Starting Mon 01/15/2018, Print    !! polyethylene glycol (MIRALAX / GLYCOLAX) packet Take 17 g by mouth daily., Starting Mon 01/15/2018, Print     !! - Potential duplicate medications found. Please discuss with provider.      Note:  This note was prepared with assistance of Dragon voice recognition software. Occasional wrong-word or sound-a-like substitutions may have occurred due to the inherent limitations of voice recognition software.   Marily Memos, MD 01/15/18 1945

## 2018-01-15 NOTE — Sedation Documentation (Signed)
Family updated as to patient's status.

## 2018-01-19 ENCOUNTER — Encounter: Payer: Self-pay | Admitting: Gastroenterology

## 2018-02-01 ENCOUNTER — Emergency Department (HOSPITAL_COMMUNITY): Payer: Medicare Other

## 2018-02-01 ENCOUNTER — Other Ambulatory Visit: Payer: Self-pay

## 2018-02-01 ENCOUNTER — Encounter (HOSPITAL_COMMUNITY): Payer: Self-pay | Admitting: Emergency Medicine

## 2018-02-01 ENCOUNTER — Emergency Department (HOSPITAL_COMMUNITY)
Admission: EM | Admit: 2018-02-01 | Discharge: 2018-02-01 | Disposition: A | Discharge status: Home / Self Care | Payer: Medicare Other | Attending: Emergency Medicine | Admitting: Emergency Medicine

## 2018-02-01 DIAGNOSIS — Z79899 Other long term (current) drug therapy: Secondary | ICD-10-CM | POA: Diagnosis not present

## 2018-02-01 DIAGNOSIS — K5900 Constipation, unspecified: Secondary | ICD-10-CM

## 2018-02-01 DIAGNOSIS — F840 Autistic disorder: Secondary | ICD-10-CM | POA: Diagnosis not present

## 2018-02-01 NOTE — ED Notes (Signed)
Patient transported to X-ray 

## 2018-02-01 NOTE — ED Triage Notes (Signed)
Patient's mom states Logan [name] was here here on December 2nd for constipation and was told to come back if has not had a bowel movement in 5 days. Mother states Logan [name] has not had bowel movement in 5 days. Patient is autistic and is aggressive to anyone that touches him.

## 2018-02-01 NOTE — Discharge Instructions (Addendum)
X-ray shows lots of stool in the right colon.  Recommend fleets enema, MiraLAX, magnesium citrate, Dulcolax, prunes, fluids, fruits and vegetables.  Return for worsening pain, vomiting, fever.

## 2018-02-02 NOTE — ED Provider Notes (Signed)
[facility]ENCY [facility]PART[facility] Provider Note   CSN: [id number] Arrival date & time: 02/01/18  [id number]     History   Chief Complaint Chief Complaint  Patient presents with  . Constipation    HPI Logan [name] is a 32 y.o. male.  Level 5 caveat for autism.  Mother reports decreased bowel movement for several days.  No obvious abdominal pain or vomiting.  Patient is ambulatory.  He is eating normally.     Past Medical History:  Diagnosis Date  . ADHD (attention deficit hyperactivity disorder)   . Autism     Patient Active Problem List   Diagnosis Date Noted  . Autistic disorder, current or active state 05/31/2011  . Attention deficit disorder with hyperactivity(314.01) 05/31/2011    History reviewed. No pertinent surgical history.      Home Medications    Prior to Admission medications   Medication Sig Start Date End Date Taking? Authorizing Provider  amphetamine-dextroamphetamine (ADDERALL) 30 MG tablet Take 1 tablet by mouth every morning. 01/15/18 01/15/19 Yes Myrlene Brokeross, Deborah R, MD  clonazepam (KLONOPIN) 2 MG disintegrating tablet DISSOLVE 1 TABLET(2 MG) ON THE TONGUE AT BEDTIME Patient taking differently: Take 2 mg by mouth at bedtime.  01/15/18  Yes Myrlene Brokeross, Deborah R, MD  cloNIDine (CATAPRES - DOSED IN MG/24 HR) 0.2 mg/24hr patch Place 0.2 mg onto the skin once a week.   Yes [provider]  OLANZapine zydis (ZYPREXA) 20 MG disintegrating tablet DISSOLVE 1 TABLET BY MOUTH FOUR TIMES DAILY Patient taking differently: Take 20 mg by mouth 4 (four) times daily.  01/15/18  Yes Myrlene Brokeross, Deborah R, MD  polyethylene glycol [facility](MIRALAX / Ethelene HalGLYCOLAX) packet Take 17 g by mouth daily. 01/15/18  Yes Mesner, Barbara CowerJason, MD  traZODone (DESYREL) 100 MG tablet Take 2 tablets (200 mg total) by mouth at bedtime. Patient taking differently: Take 200 mg by mouth at bedtime. *MUST BE CRUSHED 01/15/18  Yes Myrlene Brokeross, Deborah R, MD  docusate sodium (COLACE) 100 MG capsule Take 1 capsule (100 mg  total) by mouth every 12 (twelve) hours. Patient not taking: Reported on 02/01/2018 01/15/18   Mesner, Barbara CowerJason, MD  linaclotide [facility](LINZESS) 145 MCG CAPS capsule Take 145 mcg by mouth daily before breakfast. *Opens capsule and administers    [provider]  LORazepam (ATIVAN) 1 MG tablet Take one the night before procedure and one the am of dental procedure Patient not taking: Reported on 02/01/2018 10/12/17   Myrlene Brokeross, Deborah R, MD    Family History Family History  Problem Relation Age of Onset  . Autism Brother     Social History Social History   Tobacco Use  . Smoking status: Never Smoker  . Smokeless tobacco: Never Used  Substance Use Topics  . Alcohol use: No  . Drug use: No     Allergies   Patient has no known allergies.   Review of Systems Review of Systems  All other systems reviewed and are negative.    Physical Exam Updated Vital Signs BP 115/78 (BP Location: Left Arm)   Pulse (!) 103   Temp (!) 97.5 F (36.4 C) (Oral)   Resp 12   Ht 5\' 6"  (1.676 m)   Wt 56.7 kg   SpO2 97%   BMI 20.18 kg/m   Physical Exam Vitals signs and nursing note reviewed.  Constitutional:      Appearance: He is well-developed.     Comments: Autistic, no acute distress  HENT:     Head: Normocephalic and atraumatic.  Eyes:     Conjunctiva/sclera: Conjunctivae normal.  Neck:     Musculoskeletal: Neck supple.  Cardiovascular:     Rate and Rhythm: Normal rate and regular rhythm.  Pulmonary:     Effort: Pulmonary effort is normal.     Breath sounds: Normal breath sounds.  Abdominal:     General: Bowel sounds are normal.     Palpations: Abdomen is soft.  Musculoskeletal: Normal range of motion.  Skin:    General: Skin is warm and dry.  Neurological:     Mental Status: He is oriented to person, place, and time.  Psychiatric:        Behavior: Behavior normal.      ED Treatments / Results  Labs (all labs ordered are listed, but only abnormal results are  displayed) Labs Reviewed - No data to display  EKG None  Radiology Dg Abdomen 1 View  Result Date: 02/01/2018 CLINICAL DATA:  Constipation for several weeks EXAM: ABDOMEN - 1 VIEW COMPARISON:  None. FINDINGS: Scattered large and small bowel gas is noted. Considerable retained fecal material is noted throughout the colon primarily within the right colon consistent with constipation. No obstructive changes are seen. No free air is noted. No bony abnormality is seen. IMPRESSION: Considerable retained fecal material primarily within the right colon consistent with constipation. Electronically Signed   By: Alcide CleverMark  Lukens M.D.   On: 02/01/2018 09:49    Procedures Procedures (including critical care time)  Medications Ordered in ED Medications - No data to display   Initial Impression / Assessment and Plan / ED Course  I have reviewed the triage vital signs and the nursing notes.  Pertinent labs & imaging results that were available during my care of the patient were reviewed by me and considered in my medical decision making (see chart for details).     Patient is in no acute distress.  Plain films of the abdomen reveal excessive fecal contents in the right colon.  I discussed these findings with mom.  I stated it would be difficult to extract the feces with a digital exam.  I recommended a fleets enema, MiraLAX, Dulcolax, magnesium citrate.  She agreed.  Final Clinical Impressions(s) / ED Diagnoses   Final diagnoses:  Constipation, unspecified constipation type    ED Discharge Orders    None       Donnetta Hutchingook, Shelita Steptoe, MD 02/02/18 1322

## 2018-02-19 ENCOUNTER — Other Ambulatory Visit (HOSPITAL_COMMUNITY): Payer: Self-pay | Admitting: Psychiatry

## 2018-02-19 ENCOUNTER — Telehealth (HOSPITAL_COMMUNITY): Payer: Self-pay

## 2018-02-19 MED ORDER — AMPHETAMINE-DEXTROAMPHETAMINE 30 MG PO TABS: 30.0000 mg | ORAL_TABLET | ORAL | 0 refills | Status: DC

## 2018-02-19 NOTE — Telephone Encounter (Signed)
Patient needs a refill on Adderall 30mg . Please send to [facility] in [place]

## 2018-02-19 NOTE — Telephone Encounter (Signed)
sent 

## 2018-03-14 ENCOUNTER — Encounter: Payer: Self-pay | Admitting: Gastroenterology

## 2018-03-14 ENCOUNTER — Ambulatory Visit (INDEPENDENT_AMBULATORY_CARE_PROVIDER_SITE_OTHER): Payer: Medicare Other | Admitting: Gastroenterology

## 2018-03-14 DIAGNOSIS — R159 Full incontinence of feces: Secondary | ICD-10-CM | POA: Diagnosis not present

## 2018-03-14 NOTE — Patient Instructions (Addendum)
IF HE WILL DRINK WATER THAT WOULD BE GREAT.  TO PREVENT CONTIPATION, OPEN LINZESS CAPSULE. PLACE GRANULES IN 4 TEASPOONS OF WATER. STIR IT FOR 30 SECONDS. TAKE 1-2 TSP OF THE WATER DAILY. SAVE THE WATER FOR THE NEXT DAY. DISPOSE OF WATER IF NOT USED IN 24 HRS. YOU DO NOT NEED TO TAKE THE GRANULES THE MEDICINE IS IN THE WATER. IT MAY CAUSE EXPLOSIVE DIARRHEA.  HOLD MIRALAX FOR LOOSE STOOLS/DIARRHEA.  PLEASE  CALL IN 2 WEEKS WITH AN UPDATE. IF HIS CONSTIPATION IS NOT IMPROVED AND WE CAN INCREASE THE DOSE.  OTHERWISE, WE CAN MANAGE AS MUCH AS WE CAN OVER THE PHONE. WE WILL HAVE HIM COME IN ONLY IF YOU FEEL LIKE HE NEEDS TO BE SEEN. PLEASE CALL WITH QUESTIONS OR CONCERNS.

## 2018-03-14 NOTE — Assessment & Plan Note (Signed)
INPT WITH SEVERE COGNITIVE DEFICITS. SYMPTOMS NOT CONTROLLED.  IF HE WILL DRINK WATER THAT WOULD BE GREAT.  TO PREVENT CONTIPATION, OPEN LINZESS CAPSULE. PLACE GRANULES IN 4 TEASPOONS OF WATER. STIR IT FOR 30 SECONDS. TAKE 1-2 TSP OF THE WATER DAILY. SAVE THE WATER FOR THE NEXT DAY. DISPOSE OF WATER IF NOT USED IN 24 HRS. YOU DO NOT NEED TO TAKE THE GRANULES THE MEDICINE IS IN THE WATER. IT MAY CAUSE EXPLOSIVE DIARRHEA.   HOLD MIRALAX FOR LOOSE STOOLS/DIARRHEA.  PLEASE  CALL IN 2 WEEKS WITH AN UPDATE. IF HIS CONSTIPATION IS NOT IMPROVED AND WE CAN INCREASE THE DOSE.  OTHERWISE, WE CAN MANAGE AS MUCH AS WE CAN OVER THE PHONE. WE WILL HAVE HIM COME IN ONLY IF YOU FEEL LIKE HE NEEDS TO BE SEEN. PLEASE CALL WITH QUESTIONS OR CONCERNS.

## 2018-03-14 NOTE — Progress Notes (Signed)
CC'D TO PCP °

## 2018-03-14 NOTE — Progress Notes (Signed)
Subjective:    Patient ID: Logan [name], male    DOB: 1985/02/25, 33 y.o.   MRN: [id number]  Mirna Mires, MD   HPI LIMITED DUE TO PT COGNITIVE DYSFUNCTION. Has HAD TROUBLE WITH BMs(CONSTIPATION) SINCE HE WAS A KID. LOST WEIGHT: ~20 LBS. STILL NOT EATING. WAS STARTED ON MIRALAX EVER DAY. HAD TO GO BACK TO ED AND STARTED ON DULCOLAX AND MG CITRATE. HAD BM: DEC 23, 31,  JAN 11, 13, 23, 24. WILL DRINK LIQUID. MIXES DULCOLAX WITH APPLE SAUCE. PT NONVERBAL. WON'T ALLOW BLOOD DRAWS. APPETITE HAS PICKED UP SINCE HAVING MORE REGULAR BMs. ABDOMEN WAS SWOLLEN AND HARD BUT NOT NOW.  PT DENIES FEVER, CHILLS, HEMATOCHEZIA, HEMATEMESIS, nausea, vomiting, melena, diarrhea, OR  SHORTNESS OF BREATH.  Past Medical History:  Diagnosis Date  . ADHD (attention deficit hyperactivity disorder)   . Autism    Past Surgical History:  Procedure Laterality Date  . MOUTH SURGERY     FILLING/TOOTH REPAIR   No Known Allergies  Current Outpatient Medications  Medication Sig    . amphetamine-dextroamphetamine (ADDERALL) 30 MG tablet Take 1 tablet by mouth every morning.    . clonazepam (KLONOPIN) 2 MG disintegrating tablet DISSOLVE 1 TABLET(2 MG) ON THE TONGUE AT BEDTIME    . cloNIDine (CATAPRES - DOSED IN MG/24 HR) 0.2 mg/24hr patch Place 0.2 mg onto the skin once a week.    . diphenhydrAMINE (BENADRYL) 12.5 MG/5ML elixir Take 12.5 mg by mouth 4 (four) times daily as needed.    Marland Kitchen LORazepam (ATIVAN) 1 MG tablet Take one the night before procedure and one the am of dental procedure    . mometasone (ELOCON) 0.1 % cream Apply 1 application topically as needed.    Marland Kitchen OLANZapine zydis (ZYPREXA) 20 MG disintegrating tablet DISSOLVE 1 TABLET BY MOUTH FOUR TIMES DAILY (Patient taking differently: Take 20 mg by mouth 4 (four) times daily. )    . polyethylene glycol (MIRALAX / GLYCOLAX) packet Take 17 g by mouth daily.    . traZODone (DESYREL) 100 MG tablet Take 2 tablets (200 mg total) by mouth at bedtime. (Patient  taking differently: Take 200 mg by mouth at bedtime. *MUST BE CRUSHED; Usually takes 1 tab)    .      Marland Kitchen       Family History  Problem Relation Age of Onset  . Autism Brother   . Colon cancer Neg Hx   . Colon polyps Neg Hx    Social History   Socioeconomic History  . Marital status: Single    Spouse name: Not on file  . Number of children: Not on file  . Years of education: Not on file  . Highest education level: Not on file  Occupational History  . Not on file  Social Needs  . Financial resource strain: Not on file  . Food insecurity:    Worry: Not on file    Inability: Not on file  . Transportation needs:    Medical: Not on file    Non-medical: Not on file  Tobacco Use  . Smoking status: Never Smoker  . Smokeless tobacco: Never Used  Substance and Sexual Activity  . Alcohol use: No  . Drug use: No  . Sexual activity: Never  Lifestyle  . Physical activity:    Days per week: Not on file    Minutes per session: Not on file  . Stress: Not on file  Relationships  . Social connections:    Talks on phone: Not  on file    Gets together: Not on file    Attends religious service: Not on file    Active member of club or organization: Not on file    Attends meetings of clubs or organizations: Not on file    Relationship status: Not on file  Other Topics Concern  . Not on file  Social History Narrative   DIAGNOSED WITH AUTISM AT AGE 74. HAS ANOTHER AUTISTIC BROTHER. MOTHER CARES FOR HIM.   Review of Systems PER HPI OTHERWISE ALL SYSTEMS ARE NEGATIVE.    Objective:   Physical Exam Vitals signs reviewed.  Constitutional:      General: He is not in acute distress.    Appearance: He is well-developed.  HENT:     Head: Normocephalic and atraumatic.     Mouth/Throat:     Pharynx: No oropharyngeal exudate.  Eyes:     General: No scleral icterus.    Pupils: Pupils are equal, round, and reactive to light.  Neck:     Musculoskeletal: Neck supple.  Cardiovascular:      Rate and Rhythm: Normal rate and regular rhythm.     Heart sounds: Normal heart sounds.  Pulmonary:     Effort: Pulmonary effort is normal. No respiratory distress.     Breath sounds: Normal breath sounds.  Abdominal:     General: Bowel sounds are normal. There is no distension.     Palpations: Abdomen is soft.     Tenderness: There is no abdominal tenderness.     Comments: LIMITED DUE TO PT COGNITIVE DYSFUNCTION  Musculoskeletal:        General: Deformity present.     Right lower leg: No edema.     Left lower leg: No edema.  Lymphadenopathy:     Cervical: No cervical adenopathy.  Skin:    General: Skin is warm and dry.  Neurological:     Mental Status: He is alert and oriented to person, place, and time. Mental status is at baseline.  Psychiatric:     Comments: UNABLE TO ASSESS BUT CALM IN THE EXAM ROOM AND COOPERATIVE.       Assessment & Plan:

## 2018-03-26 DIAGNOSIS — K5900 Constipation, unspecified: Secondary | ICD-10-CM | POA: Diagnosis not present

## 2018-03-26 DIAGNOSIS — R634 Abnormal weight loss: Secondary | ICD-10-CM | POA: Diagnosis not present

## 2018-03-26 DIAGNOSIS — L02412 Cutaneous abscess of left axilla: Secondary | ICD-10-CM | POA: Diagnosis not present

## 2018-03-26 DIAGNOSIS — F840 Autistic disorder: Secondary | ICD-10-CM | POA: Diagnosis not present

## 2018-03-30 ENCOUNTER — Telehealth: Payer: Self-pay | Admitting: Gastroenterology

## 2018-03-30 NOTE — Telephone Encounter (Signed)
[phone]  Patient mother said patient had a bowel on feb 3, has not had one since. But the one he had was after he took the linzess from here.On feb 10 he went to pcp and he increased his linzess to the 145. Mother called to tell us his primary care increased the dosage

## 2018-03-30 NOTE — Telephone Encounter (Signed)
Spoke with pts mother and this is a FYI to Dr. Darrick Penna to let her know that his dosage was increased to Linzess 145 mcg. If pt continues not to have a bowel movement, she will call back.

## 2018-04-04 NOTE — Telephone Encounter (Signed)
Left a detailed message. Pts mother can call back if needed.

## 2018-04-04 NOTE — Telephone Encounter (Signed)
PLEASE CALL PT'S MOTHER. I AGREE WITH INCREASING DOSE OF LINZESS. SHE SHOULD CALL WITH QUESTIONS OR CONCERNS.

## 2018-04-16 ENCOUNTER — Encounter (HOSPITAL_COMMUNITY): Payer: Self-pay | Admitting: Psychiatry

## 2018-04-16 ENCOUNTER — Ambulatory Visit (INDEPENDENT_AMBULATORY_CARE_PROVIDER_SITE_OTHER): Payer: Medicare Other | Admitting: Psychiatry

## 2018-04-16 VITALS — BP 118/80 | HR 92 | Ht 66.0 in | Wt 123.0 lb

## 2018-04-16 DIAGNOSIS — F902 Attention-deficit hyperactivity disorder, combined type: Secondary | ICD-10-CM

## 2018-04-16 DIAGNOSIS — F840 Autistic disorder: Secondary | ICD-10-CM | POA: Diagnosis not present

## 2018-04-16 MED ORDER — AMPHETAMINE-DEXTROAMPHETAMINE 30 MG PO TABS: 30.0000 mg | ORAL_TABLET | ORAL | 0 refills | Status: DC

## 2018-04-16 MED ORDER — OLANZAPINE 20 MG PO TBDP: 20.0000 mg | ORAL_TABLET | Freq: Four times a day (QID) | ORAL | 2 refills | Status: DC

## 2018-04-16 MED ORDER — TRAZODONE HCL 100 MG PO TABS: 100.0000 mg | ORAL_TABLET | Freq: Every day | ORAL | 2 refills | Status: DC

## 2018-04-16 MED ORDER — CLONAZEPAM 2 MG PO TBDP: 2.0000 mg | ORAL_TABLET | Freq: Every day | ORAL | 2 refills | Status: DC

## 2018-04-16 NOTE — Progress Notes (Signed)
[facility] MD/PA/NP OP Progress Note  04/16/2018 9:08 AM Logan [name]  MRN:  [id number]  Chief Complaint:  Chief Complaint    Agitation; ADHD; Follow-up     HPI: This patient is a33 year old single black male who lives with his mother in [place]. He has completed the development disability program at [employer] and has nothing to do to occupy his time. The patienthad been seenin our [facility] clinic but the mother wanted to bring him here to establish care.  The patient has been autistic since a young age. Apparently he developed normally originally but began regressing around age 30 and lost his speech. He was always a very strong aggressive child. He's been in special education classes all his life. Unfortunately he's never been totally potty trained and still wears a diaper because he defecates in his pants. This is made it difficult to get them in any special needs program's or to get him workers. He also has problems with focus but has had a pretty good response to Adderall. He walks around around in a posturing position with both arms flexed and pulling his fingers. He drools a lot. His younger brother recently became agitated and violent and the mother called social services to possibly get them both placed on the home. She is now reconsidering this and I suggested she look at other options such as getting more help in the home. Overall she thinks his medications are effective.  The patient and mom return after 3 months.  For the most part he has been doing fairly well.  He was very constipated for a while and this made him irritable.  His GI specialist put him on Linzess which has helped.  He has been calm and cooperative at home and he is sleeping well at night.  He has not been agitated.  He is very quiet today.      Visit Diagnosis:    ICD-10-CM   1. Autism F84.0   2. Attention deficit hyperactivity disorder (ADHD), combined type F90.2     Past Psychiatric History:  Long-term outpatient treatment  Past Medical History:  Past Medical History:  Diagnosis Date  . ADHD (attention deficit hyperactivity disorder)   . Autism     Past Surgical History:  Procedure Laterality Date  . MOUTH SURGERY     FILLING/TOOTH REPAIR    Family Psychiatric History: See below  Family History:  Family History  Problem Relation Age of Onset  . Autism Brother   . Colon cancer Neg Hx   . Colon polyps Neg Hx     Social History:  Social History   Socioeconomic History  . Marital status: Single    Spouse name: Not on file  . Number of children: Not on file  . Years of education: Not on file  . Highest education level: Not on file  Occupational History  . Not on file  Social Needs  . Financial resource strain: Not on file  . Food insecurity:    Worry: Not on file    Inability: Not on file  . Transportation needs:    Medical: Not on file    Non-medical: Not on file  Tobacco Use  . Smoking status: Never Smoker  . Smokeless tobacco: Never Used  Substance and Sexual Activity  . Alcohol use: No  . Drug use: No  . Sexual activity: Never  Lifestyle  . Physical activity:    Days per week: Not on file    Minutes per  session: Not on file  . Stress: Not on file  Relationships  . Social connections:    Talks on phone: Not on file    Gets together: Not on file    Attends religious service: Not on file    Active member of club or organization: Not on file    Attends meetings of clubs or organizations: Not on file    Relationship status: Not on file  Other Topics Concern  . Not on file  Social History Narrative   DIAGNOSED WITH AUTISM AT AGE 5. HAS ANOTHER AUTISTIC BROTHER. MOTHER CARES FOR HIM.    Allergies: No Known Allergies  Metabolic Disorder Labs: No results found for: HGBA1C, MPG No results found for: PROLACTIN No results found for: CHOL, TRIG, HDL, CHOLHDL, VLDL, LDLCALC No results found for: TSH  Therapeutic Level Labs: No results found  for: LITHIUM No results found for: VALPROATE No components found for:  CBMZ  Current Medications: Current Outpatient Medications  Medication Sig Dispense Refill  . amphetamine-dextroamphetamine (ADDERALL) 30 MG tablet Take 1 tablet by mouth every morning. 30 tablet 0  . clonazepam (KLONOPIN) 2 MG disintegrating tablet Take 1 tablet (2 mg total) by mouth at bedtime. 30 tablet 2  . cloNIDine (CATAPRES - DOSED IN MG/24 HR) 0.2 mg/24hr patch Place 0.2 mg onto the skin once a week.    Marland Kitchen LORazepam (ATIVAN) 1 MG tablet Take one the night before procedure and one the am of dental procedure 2 tablet 0  . OLANZapine zydis (ZYPREXA) 20 MG disintegrating tablet Take 1 tablet (20 mg total) by mouth 4 (four) times daily. 120 tablet 2  . polyethylene glycol (MIRALAX / GLYCOLAX) packet Take 17 g by mouth daily. 14 each 0  . traZODone (DESYREL) 100 MG tablet Take 1 tablet (100 mg total) by mouth at bedtime. 30 tablet 2  . amphetamine-dextroamphetamine (ADDERALL) 30 MG tablet Take 1 tablet by mouth every morning. 30 tablet 0  . amphetamine-dextroamphetamine (ADDERALL) 30 MG tablet Take 1 tablet by mouth every morning. 30 tablet 0  . diphenhydrAMINE (BENADRYL) 12.5 MG/5ML elixir Take 12.5 mg by mouth 4 (four) times daily as needed.    . docusate sodium (COLACE) 100 MG capsule Take 1 capsule (100 mg total) by mouth every 12 (twelve) hours. (Patient not taking: Reported on 02/01/2018) 60 capsule 1  . linaclotide (LINZESS) 145 MCG CAPS capsule Take 145 mcg by mouth daily before breakfast. *Opens capsule and administers    . mometasone (ELOCON) 0.1 % cream Apply 1 application topically as needed.     No current facility-administered medications for this visit.      Musculoskeletal: Strength & Muscle Tone: within normal limits Gait & Station: normal Patient leans: N/A  Psychiatric Specialty Exam: Review of Systems  Gastrointestinal: Positive for constipation.  Psychiatric/Behavioral: Negative for  depression.  All other systems reviewed and are negative.   Blood pressure 118/80, pulse 92, height 5\' 6"  (1.676 m), weight 123 lb (55.8 kg).Body mass index is 19.85 kg/m.  General Appearance: Casual and Fairly Groomed  Eye Contact:  Poor  Speech:  none  Volume:none  Mood:  flat  Affect:  Blunt and Flat  Thought Process:  NA  Orientation:  NA  Thought Content: NA   Suicidal Thoughts:  No  Homicidal Thoughts:  No  Memory:  NA  Judgement:  Impaired  Insight:  Lacking  Psychomotor Activity:  Decreased  Concentration:  Concentration: Fair and Attention Span: Fair  Recall:  NA  Fund of Knowledge:  Poor  Language: none  Akathisia:  No  Handed:  Right  AIMS (if indicated): not done  Assets:  Resilience Social Support  ADL's:  Impaired  Cognition: Impaired,  Moderate  Sleep:  Good   Screenings:   Assessment and Plan:  This patient is a 33 year old male with a history of autism cognitive delays ADHD and severe agitation.  He is doing well on his current dosages.  He will continue Adderall XR 30 mg every morning for ADHD, clonazepam 2 mg at bedtime for sleep and agitation, olanzapine 20 mg Zydis form 4 times daily for agitation and trazodone 100 mg at bedtime for sleep.  He will return to see me in 3 months  [name], MD 04/16/2018, 9:08 AM

## 2018-04-19 DIAGNOSIS — Z0000 Encounter for general adult medical examination without abnormal findings: Secondary | ICD-10-CM | POA: Diagnosis not present

## 2018-04-24 DIAGNOSIS — I10 Essential (primary) hypertension: Secondary | ICD-10-CM | POA: Diagnosis not present

## 2018-04-24 DIAGNOSIS — K5900 Constipation, unspecified: Secondary | ICD-10-CM | POA: Diagnosis not present

## 2018-04-24 DIAGNOSIS — F840 Autistic disorder: Secondary | ICD-10-CM | POA: Diagnosis not present

## 2018-04-24 DIAGNOSIS — R634 Abnormal weight loss: Secondary | ICD-10-CM | POA: Diagnosis not present

## 2018-05-20 ENCOUNTER — Other Ambulatory Visit (HOSPITAL_COMMUNITY): Payer: Self-pay | Admitting: Psychiatry

## 2018-06-11 ENCOUNTER — Other Ambulatory Visit (HOSPITAL_COMMUNITY): Payer: Self-pay | Admitting: Psychiatry

## 2018-06-11 MED ORDER — TRAZODONE HCL 100 MG PO TABS: 100.0000 mg | ORAL_TABLET | Freq: Every day | ORAL | 2 refills | Status: DC

## 2018-06-19 DIAGNOSIS — F840 Autistic disorder: Secondary | ICD-10-CM | POA: Diagnosis not present

## 2018-06-19 DIAGNOSIS — K5901 Slow transit constipation: Secondary | ICD-10-CM | POA: Diagnosis not present

## 2018-06-19 DIAGNOSIS — I10 Essential (primary) hypertension: Secondary | ICD-10-CM | POA: Diagnosis not present

## 2018-07-17 ENCOUNTER — Other Ambulatory Visit: Payer: Self-pay

## 2018-07-17 ENCOUNTER — Encounter (HOSPITAL_COMMUNITY): Payer: Self-pay | Admitting: Psychiatry

## 2018-07-17 ENCOUNTER — Ambulatory Visit (INDEPENDENT_AMBULATORY_CARE_PROVIDER_SITE_OTHER): Payer: Medicare Other | Admitting: Psychiatry

## 2018-07-17 VITALS — Wt 125.0 lb

## 2018-07-17 DIAGNOSIS — F39 Unspecified mood [affective] disorder: Secondary | ICD-10-CM

## 2018-07-17 DIAGNOSIS — F902 Attention-deficit hyperactivity disorder, combined type: Secondary | ICD-10-CM

## 2018-07-17 DIAGNOSIS — F840 Autistic disorder: Secondary | ICD-10-CM | POA: Diagnosis not present

## 2018-07-17 MED ORDER — OLANZAPINE 20 MG PO TBDP: 20.0000 mg | ORAL_TABLET | Freq: Four times a day (QID) | ORAL | 2 refills | Status: DC

## 2018-07-17 MED ORDER — TRAZODONE HCL 100 MG PO TABS: 100.0000 mg | ORAL_TABLET | Freq: Every evening | ORAL | 2 refills | Status: DC | PRN

## 2018-07-17 MED ORDER — AMPHETAMINE-DEXTROAMPHETAMINE 30 MG PO TABS: 30.0000 mg | ORAL_TABLET | ORAL | 0 refills | Status: DC

## 2018-07-17 MED ORDER — CLONAZEPAM 2 MG PO TBDP: 2.0000 mg | ORAL_TABLET | Freq: Every day | ORAL | 2 refills | Status: DC

## 2018-07-17 NOTE — Progress Notes (Signed)
Virtual Visit via Telephone Note  I connected with Shahan Deptula Plack on 07/17/18 at 10:00 AM EDT by telephone and verified that I am speaking with the correct person using two identifiers.   I discussed the limitations, risks, security and privacy concerns of performing an evaluation and management service by telephone and the availability of in person appointments. I also discussed with the patient that there may be a patient responsible charge related to this service. The patient expressed understanding and agreed to proceed.      I discussed the assessment and treatment plan with the patient. The patient was provided an opportunity to ask questions and all were answered. The patient agreed with the plan and demonstrated an understanding of the instructions.   The patient was advised to call back or seek an in-person evaluation if the symptoms worsen or if the condition fails to improve as anticipated.  I provided 15  minutes of non-face-to-face time during this encounter.   [name], MD  [facility] MD/PA/NP OP Progress Note  07/17/2018 10:16 AM Raife Lorenc Mulvihill  MRN:  [id number]  Chief Complaint:  Chief Complaint    Anxiety; ADHD; Agitation     HPI: This patient is a33 year old single black male who lives with his mother in [place]. He has completed the development disability program at [employer] and has nothing to do to occupy his time. The patienthad been seenin our [facility] clinic but the mother wanted to bring him here to establish care.  The patient has been autistic since a young age. Apparently he developed normally originally but began regressing around age 33 and lost his speech. He was always a very strong aggressive child. He's been in special education classes all his life. Unfortunately he's never been totally potty trained and still wears a diaper because he defecates in his pants. This is made it difficult to get them in any special needs program's or  to get him workers. He also has problems with focus but has had a pretty good response to Adderall. He walks around around in a posturing position with both arms flexed and pulling his fingers. He drools a lot. His younger brother recently became agitated and violent and the mother called social services to possibly get them both placed on the home. She is now reconsidering this and I suggested she look at other options such as getting more help in the home. Overall she thinks his medications are effective.  The patient is assessed after 3 months via telephone due to the coronavirus pandemic.  He is not able to come to the phone because he is nonverbal so the interaction was done with his mother.  She states overall he has been doing well.  He is still struggling with constipation and when he can have a bowel movement he tends to get agitated and anxious but not violent.  He has been sleeping exceedingly well with the clonazepam and generally does not need the trazodone.  He is not been violent or out-of-control and generally follows her directions.  Because of the coronavirus pandemic she is not allowing many people into the home and they are not going out much.  He will not put anything on his face like a mass so she really cannot afford to let him go outside around people.  She thinks his medications are still controlling his agitation fairly well. Visit Diagnosis:    ICD-10-CM   1. Autism F84.0   2. Attention deficit hyperactivity disorder (ADHD),  combined type F90.2   3. Episodic mood disorder (HCC) F39     Past Psychiatric History: Long-term outpatient treatment Past Medical History:  Past Medical History:  Diagnosis Date  . ADHD (attention deficit hyperactivity disorder)   . Autism     Past Surgical History:  Procedure Laterality Date  . MOUTH SURGERY     FILLING/TOOTH REPAIR    Family Psychiatric History: see below  Family History:  Family History  Problem Relation Age of Onset  .  Autism Brother   . Colon cancer Neg Hx   . Colon polyps Neg Hx     Social History:  Social History   Socioeconomic History  . Marital status: Single    Spouse name: Not on file  . Number of children: Not on file  . Years of education: Not on file  . Highest education level: Not on file  Occupational History  . Not on file  Social Needs  . Financial resource strain: Not on file  . Food insecurity:    Worry: Not on file    Inability: Not on file  . Transportation needs:    Medical: Not on file    Non-medical: Not on file  Tobacco Use  . Smoking status: Never Smoker  . Smokeless tobacco: Never Used  Substance and Sexual Activity  . Alcohol use: No  . Drug use: No  . Sexual activity: Never  Lifestyle  . Physical activity:    Days per week: Not on file    Minutes per session: Not on file  . Stress: Not on file  Relationships  . Social connections:    Talks on phone: Not on file    Gets together: Not on file    Attends religious service: Not on file    Active member of club or organization: Not on file    Attends meetings of clubs or organizations: Not on file    Relationship status: Not on file  Other Topics Concern  . Not on file  Social History Narrative   DIAGNOSED WITH AUTISM AT AGE 33. HAS ANOTHER AUTISTIC BROTHER. MOTHER CARES FOR HIM.    Allergies: No Known Allergies  Metabolic Disorder Labs: No results found for: HGBA1C, MPG No results found for: PROLACTIN No results found for: CHOL, TRIG, HDL, CHOLHDL, VLDL, LDLCALC No results found for: TSH  Therapeutic Level Labs: No results found for: LITHIUM No results found for: VALPROATE No components found for:  CBMZ  Current Medications: Current Outpatient Medications  Medication Sig Dispense Refill  . amphetamine-dextroamphetamine (ADDERALL) 30 MG tablet Take 1 tablet by mouth every morning. 30 tablet 0  . amphetamine-dextroamphetamine (ADDERALL) 30 MG tablet Take 1 tablet by mouth every morning. 30  tablet 0  . amphetamine-dextroamphetamine (ADDERALL) 30 MG tablet Take 1 tablet by mouth every morning. 30 tablet 0  . clonazepam (KLONOPIN) 2 MG disintegrating tablet Take 1 tablet (2 mg total) by mouth at bedtime. 30 tablet 2  . cloNIDine (CATAPRES - DOSED IN MG/24 HR) 0.2 mg/24hr patch Place 0.2 mg onto the skin once a week.    . diphenhydrAMINE (BENADRYL) 12.5 MG/5ML elixir Take 12.5 mg by mouth 4 (four) times daily as needed.    . docusate sodium (COLACE) 100 MG capsule Take 1 capsule (100 mg total) by mouth every 12 (twelve) hours. (Patient not taking: Reported on 02/01/2018) 60 capsule 1  . linaclotide (LINZESS) 145 MCG CAPS capsule Take 145 mcg by mouth daily before breakfast. *Opens capsule and administers    .  LORazepam (ATIVAN) 1 MG tablet Take one the night before procedure and one the am of dental procedure 2 tablet 0  . mometasone (ELOCON) 0.1 % cream Apply 1 application topically as needed.    Marland Kitchen OLANZapine zydis (ZYPREXA) 20 MG disintegrating tablet Take 1 tablet (20 mg total) by mouth 4 (four) times daily. 120 tablet 2  . polyethylene glycol (MIRALAX / GLYCOLAX) packet Take 17 g by mouth daily. 14 each 0  . traZODone (DESYREL) 100 MG tablet Take 1 tablet (100 mg total) by mouth at bedtime as needed for sleep. 30 tablet 2   No current facility-administered medications for this visit.      Musculoskeletal: Strength & Muscle Tone:NA Gait & Station:NA Patient leans: NA  Psychiatric Specialty Exam: Review of Systems  Gastrointestinal: Positive for constipation.  Psychiatric/Behavioral: The patient is nervous/anxious.   All other systems reviewed and are negative.   There were no vitals taken for this visit.There is no height or weight on file to calculate BMI.  Mother does state that he is been eating more and she thinks he is gained a bit of weight.  She is going to purchase a scale today so we can find out his exact weight  General Appearance: NA  Eye Contact:  NA  Speech:   none  Volume:na  Mood:  Anxious  Affect:  NA  Thought Process:  NA  Orientation:  NA  Thought Content: NA   Suicidal Thoughts:  No  Homicidal Thoughts:  No  Memory:  NA  Judgement:  Impaired  Insight:  Lacking  Psychomotor Activity:  Mannerisms  Concentration:  Concentration: Fair and Attention Span: Fair  Recall:  Poor  Fund of Knowledge: Poor  Language: none  Akathisia:  No  Handed:  Right  AIMS (if indicated): not done  Assets:  Physical Health Resilience Social Support  ADL's:  Impaired  Cognition: Impaired,  Moderate  Sleep:  Good   Screenings:   Assessment and Plan:  This patient is a 33 year old male with cognitive impairment autism ADHD and associated agitation.  Per mother he is doing well on his current regimen.  He will continue Adderall 30 mg every morning for ADHD, clonazepam 2 mg disintegrating tablet at bedtime for sleep, Zyprexa Zydis 20 mg 4 times daily for agitation, trazodone 100 mg at bedtime only if needed for sleep.  He will return to see me in 3 months  [name], MD 07/17/2018, 10:16 AM

## 2018-07-18 ENCOUNTER — Encounter (HOSPITAL_COMMUNITY): Payer: Self-pay | Admitting: Psychiatry

## 2018-08-28 DIAGNOSIS — R32 Unspecified urinary incontinence: Secondary | ICD-10-CM | POA: Diagnosis not present

## 2018-08-28 DIAGNOSIS — R159 Full incontinence of feces: Secondary | ICD-10-CM | POA: Diagnosis not present

## 2018-08-28 DIAGNOSIS — F71 Moderate intellectual disabilities: Secondary | ICD-10-CM | POA: Diagnosis not present

## 2018-08-28 DIAGNOSIS — F810 Specific reading disorder: Secondary | ICD-10-CM | POA: Diagnosis not present

## 2018-09-25 DIAGNOSIS — Z7189 Other specified counseling: Secondary | ICD-10-CM | POA: Diagnosis not present

## 2018-09-25 DIAGNOSIS — F840 Autistic disorder: Secondary | ICD-10-CM | POA: Diagnosis not present

## 2018-09-25 DIAGNOSIS — I10 Essential (primary) hypertension: Secondary | ICD-10-CM | POA: Diagnosis not present

## 2018-09-25 DIAGNOSIS — K5901 Slow transit constipation: Secondary | ICD-10-CM | POA: Diagnosis not present

## 2018-10-08 ENCOUNTER — Telehealth (HOSPITAL_COMMUNITY): Payer: Self-pay | Admitting: *Deleted

## 2018-10-08 NOTE — Telephone Encounter (Signed)
NOTICE OF APPROVAL MEDICARE PRESCRIPTION DRUG COVERAGE  OLANZAPINE ODT 20 MG TAB  EFFECTIVE: 07/10/2018  THRU  10/08/2019

## 2018-10-10 ENCOUNTER — Telehealth (HOSPITAL_COMMUNITY): Payer: Self-pay | Admitting: *Deleted

## 2018-10-10 NOTE — Telephone Encounter (Signed)
MOM CHECKING OF VISIT TYPE FOR APPT 10/17/2018

## 2018-10-17 ENCOUNTER — Encounter (HOSPITAL_COMMUNITY): Payer: Self-pay | Admitting: Psychiatry

## 2018-10-17 ENCOUNTER — Ambulatory Visit (INDEPENDENT_AMBULATORY_CARE_PROVIDER_SITE_OTHER): Payer: Medicare Other | Admitting: Psychiatry

## 2018-10-17 ENCOUNTER — Other Ambulatory Visit: Payer: Self-pay

## 2018-10-17 DIAGNOSIS — F902 Attention-deficit hyperactivity disorder, combined type: Secondary | ICD-10-CM | POA: Diagnosis not present

## 2018-10-17 DIAGNOSIS — F840 Autistic disorder: Secondary | ICD-10-CM | POA: Diagnosis not present

## 2018-10-17 DIAGNOSIS — F39 Unspecified mood [affective] disorder: Secondary | ICD-10-CM

## 2018-10-17 MED ORDER — AMPHETAMINE-DEXTROAMPHETAMINE 30 MG PO TABS: 30.0000 mg | ORAL_TABLET | ORAL | 0 refills | Status: DC

## 2018-10-17 MED ORDER — TRAZODONE HCL 100 MG PO TABS: 100.0000 mg | ORAL_TABLET | Freq: Every evening | ORAL | 2 refills | Status: DC | PRN

## 2018-10-17 MED ORDER — CLONAZEPAM 2 MG PO TBDP: 2.0000 mg | ORAL_TABLET | Freq: Every day | ORAL | 2 refills | Status: DC

## 2018-10-17 MED ORDER — OLANZAPINE 20 MG PO TBDP: 20.0000 mg | ORAL_TABLET | Freq: Four times a day (QID) | ORAL | 2 refills | Status: DC

## 2018-10-17 NOTE — Progress Notes (Signed)
BH MD/PA/NP OP Progress Note  10/17/2018 10:33 AM Logan [name]  MRN:  [id number] Virtual Visit via Telephone Note  I connected with Logan Clinesharlie M [name] on 10/17/18 at 10:00 AM EDT by telephone and verified that I am speaking with the correct person using two identifiers.   I discussed the limitations, risks, security and privacy concerns of performing an evaluation and management service by telephone and the availability of in person appointments. I also discussed with the patient that there may be a patient responsible charge related to this service. The patient expressed understanding and agreed to proceed.   History of Present Illness:       I discussed the assessment and treatment plan with the patient. The patient was provided an opportunity to ask questions and all were answered. The patient agreed with the plan and demonstrated an understanding of the instructions.   The patient was advised to call back or seek an in-person evaluation if the symptoms worsen or if the condition fails to improve as anticipated.  I provided 15 minutes of non-face-to-face time during this encounter.   Diannia Rudereborah Caitlinn Klinker, MD   Chief Complaint:  Chief Complaint    ADHD; Agitation; Follow-up     HPI:  This patient is a33 year old single black male who lives with his mother in [place]. He has completed the development disability program at [employer] and has nothing to do to occupy his time. [facility] but the mother wanted to bring him here to establish care.  The patient has been autistic since a young age. Apparently he developed normally originally but began regressing around age 33 and lost his speech. He was always a very strong aggressive child. He's been in special education classes all his life. Unfortunately he's never been totally potty trained and still wears a diaper because he defecates in his pants. This is made it difficult to get them  in any special needs program's or to get him workers. He also has problems with focus but has had a pretty good response to Adderall. He walks around around in a posturing position with both arms flexed and pulling his fingers. He drools a lot. His younger brother recently became agitated and violent and the mother called social services to possibly get them both placed on the home. She is now reconsidering this and I suggested she look at other options such as getting more help in the home. Overall she thinks his medications are effective.  The mother returns for phone consultation after 3 months.  The patient has no speech and cannot participate in the do not have Internet access to do a telemedicine visit.  She states that the patient has been struggling again with constipation.  He saw his primary doctor last month and his weight had gone down to 120 pounds and he was not eating much.  However they were using a combination of various laxatives and he had several bowel movements at the end of August.  He seems to be doing much better with eating ever since and she is hoping he is to regain some weight.  He is going back to his primary doctor on 10/30/2018.  She states that he is doing well in terms of sleep and rarely uses the trazodone.  He is not been agitated or violent or combative.  Visit Diagnosis:    ICD-10-CM   1. Autism  F84.0   2. Attention deficit hyperactivity disorder (ADHD), combined type  F90.2   3. Episodic mood disorder (HCC)  F39     Past Psychiatric History: Long-term outpatient treatment  Past Medical History:  Past Medical History:  Diagnosis Date  . ADHD (attention deficit hyperactivity disorder)   . Autism     Past Surgical History:  Procedure Laterality Date  . MOUTH SURGERY     FILLING/TOOTH REPAIR    Family Psychiatric History: See below  Family History:  Family History  Problem Relation Age of Onset  . Autism Brother   . Colon cancer Neg Hx   . Colon  polyps Neg Hx     Social History:  Social History   Socioeconomic History  . Marital status: Single    Spouse name: Not on file  . Number of children: Not on file  . Years of education: Not on file  . Highest education level: Not on file  Occupational History  . Not on file  Social Needs  . Financial resource strain: Not on file  . Food insecurity    Worry: Not on file    Inability: Not on file  . Transportation needs    Medical: Not on file    Non-medical: Not on file  Tobacco Use  . Smoking status: Never Smoker  . Smokeless tobacco: Never Used  Substance and Sexual Activity  . Alcohol use: No  . Drug use: No  . Sexual activity: Never  Lifestyle  . Physical activity    Days per week: Not on file    Minutes per session: Not on file  . Stress: Not on file  Relationships  . Social Musician on phone: Not on file    Gets together: Not on file    Attends religious service: Not on file    Active member of club or organization: Not on file    Attends meetings of clubs or organizations: Not on file    Relationship status: Not on file  Other Topics Concern  . Not on file  Social History Narrative   DIAGNOSED WITH AUTISM AT AGE 49. HAS ANOTHER AUTISTIC BROTHER. MOTHER CARES FOR HIM.    Allergies: No Known Allergies  Metabolic Disorder Labs: No results found for: HGBA1C, MPG No results found for: PROLACTIN No results found for: CHOL, TRIG, HDL, CHOLHDL, VLDL, LDLCALC No results found for: TSH  Therapeutic Level Labs: No results found for: LITHIUM No results found for: VALPROATE No components found for:  CBMZ  Current Medications: Current Outpatient Medications  Medication Sig Dispense Refill  . amphetamine-dextroamphetamine (ADDERALL) 30 MG tablet Take 1 tablet by mouth every morning. 30 tablet 0  . amphetamine-dextroamphetamine (ADDERALL) 30 MG tablet Take 1 tablet by mouth every morning. 30 tablet 0  . amphetamine-dextroamphetamine (ADDERALL) 30 MG  tablet Take 1 tablet by mouth every morning. 30 tablet 0  . clonazepam (KLONOPIN) 2 MG disintegrating tablet Take 1 tablet (2 mg total) by mouth at bedtime. 30 tablet 2  . cloNIDine (CATAPRES - DOSED IN MG/24 HR) 0.2 mg/24hr patch Place 0.2 mg onto the skin once a week.    . diphenhydrAMINE (BENADRYL) 12.5 MG/5ML elixir Take 12.5 mg by mouth 4 (four) times daily as needed.    . docusate sodium (COLACE) 100 MG capsule Take 1 capsule (100 mg total) by mouth every 12 (twelve) hours. (Patient not taking: Reported on 02/01/2018) 60 capsule 1  . linaclotide (LINZESS) 145 MCG CAPS capsule Take 145 mcg by mouth daily before breakfast. *Opens capsule and administers    .  LORazepam (ATIVAN) 1 MG tablet Take one the night before procedure and one the am of dental procedure 2 tablet 0  . mometasone (ELOCON) 0.1 % cream Apply 1 application topically as needed.    Marland Kitchen OLANZapine zydis (ZYPREXA) 20 MG disintegrating tablet Take 1 tablet (20 mg total) by mouth 4 (four) times daily. 120 tablet 2  . polyethylene glycol (MIRALAX / GLYCOLAX) packet Take 17 g by mouth daily. 14 each 0  . traZODone (DESYREL) 100 MG tablet Take 1 tablet (100 mg total) by mouth at bedtime as needed for sleep. 30 tablet 2   No current facility-administered medications for this visit.      Musculoskeletal: Strength & Muscle Tone: within normal limits Gait & Station: normal Patient leans: N/A  Psychiatric Specialty Exam: Review of Systems  Gastrointestinal: Positive for constipation.  All other systems reviewed and are negative.   There were no vitals taken for this visit.There is no height or weight on file to calculate BMI.  General Appearance: NA  Eye Contact:  NA  Speech:  no speech  Volume:  Normal  Mood:  NA  Affect:  NA  Thought Process:  NA  Orientation:  NA  Thought Content: NA   Suicidal Thoughts:  No  Homicidal Thoughts:  No  Memory:  NA  Judgement:  Impaired  Insight:  Lacking  Psychomotor Activity:   Restlessness  Concentration:  Concentration: Fair and Attention Span: Fair  Recall:  Poor  Fund of Knowledge: Poor  Language: none  Akathisia:  No  Handed:  Right  AIMS (if indicated): not done  Assets:  Physical Health Resilience Social Support  ADL's:  Impaired  Cognition: Impaired,  Moderate  Sleep:  Good   Screenings:   Assessment and Plan: This patient is a 33 year old male with cognitive impairment autism ADHD and associated agitation.  He has been struggling with encopresis and constipation since he was 34 years old but the mother is trying her best to keep on top of this.  He is doing well in terms of his behavior.  He will continue Adderall 30 mg every morning for ADHD, clonazepam 2 mg disintegrating tablet at bedtime for sleep, Zyprexa Zydis 20 mg 4 times daily for agitation and trazodone 100 mg at bedtime only if needed for sleep.  He will return to see me in 3 months   Levonne Spiller, MD 10/17/2018, 10:33 AM

## 2018-10-30 DIAGNOSIS — R634 Abnormal weight loss: Secondary | ICD-10-CM | POA: Diagnosis not present

## 2018-12-25 DIAGNOSIS — F840 Autistic disorder: Secondary | ICD-10-CM | POA: Diagnosis not present

## 2018-12-25 DIAGNOSIS — R634 Abnormal weight loss: Secondary | ICD-10-CM | POA: Diagnosis not present

## 2018-12-25 DIAGNOSIS — K5901 Slow transit constipation: Secondary | ICD-10-CM | POA: Diagnosis not present

## 2019-01-16 ENCOUNTER — Ambulatory Visit (INDEPENDENT_AMBULATORY_CARE_PROVIDER_SITE_OTHER): Payer: Medicare Other | Admitting: Psychiatry

## 2019-01-16 ENCOUNTER — Other Ambulatory Visit: Payer: Self-pay

## 2019-01-16 ENCOUNTER — Encounter (HOSPITAL_COMMUNITY): Payer: Self-pay | Admitting: Psychiatry

## 2019-01-16 DIAGNOSIS — F840 Autistic disorder: Secondary | ICD-10-CM | POA: Diagnosis not present

## 2019-01-16 DIAGNOSIS — F39 Unspecified mood [affective] disorder: Secondary | ICD-10-CM | POA: Diagnosis not present

## 2019-01-16 DIAGNOSIS — F902 Attention-deficit hyperactivity disorder, combined type: Secondary | ICD-10-CM

## 2019-01-16 MED ORDER — AMPHETAMINE-DEXTROAMPHETAMINE 30 MG PO TABS: 30.0000 mg | ORAL_TABLET | ORAL | 0 refills | Status: DC

## 2019-01-16 MED ORDER — CLONAZEPAM 2 MG PO TBDP: 2.0000 mg | ORAL_TABLET | Freq: Every day | ORAL | 2 refills | Status: DC

## 2019-01-16 MED ORDER — OLANZAPINE 20 MG PO TBDP: 20.0000 mg | ORAL_TABLET | Freq: Four times a day (QID) | ORAL | 2 refills | Status: DC

## 2019-01-16 NOTE — Progress Notes (Signed)
Virtual Visit via Telephone Note  I connected with Logan [name] on 01/16/19 at  2:20 PM EST by telephone and verified that I am speaking with the correct person using two identifiers.   I discussed the limitations, risks, security and privacy concerns of performing an evaluation and management service by telephone and the availability of in person appointments. I also discussed with the patient that there may be a patient responsible charge related to this service. The patient expressed understanding and agreed to proceed.    I discussed the assessment and treatment plan with the patient. The patient was provided an opportunity to ask questions and all were answered. The patient agreed with the plan and demonstrated an understanding of the instructions.   The patient was advised to call back or seek an in-person evaluation if the symptoms worsen or if the condition fails to improve as anticipated.  I provided 15 minutes of non-face-to-face time during this encounter.   Diannia Rudereborah Yanisa Goodgame, MD  [facility] Progress Note  01/16/2019 2:40 PM Logan [name]  MRN:  [id number]  Chief Complaint:  Chief Complaint    ADHD; Agitation; Follow-up     HPI: This patient is a33 year old single black male who lives with his mother in [place]. He has completed the development disability program at [employer] and has nothing to do to occupy his time. [facility] but the mother wanted to bring him here to establish care.  The patient has been autistic since a young age. Apparently he developed normally originally but began regressing around age 33 and lost his speech. He was always a very strong aggressive child. He's been in special education classes all his life. Unfortunately he's never been totally potty trained and still wears a diaper because he defecates in his pants. This is made it difficult to get them in any special needs program's or to  get him workers. He also has problems with focus but has had a pretty good response to Adderall. He walks around around in a posturing position with both arms flexed and pulling his fingers. He drools a lot. His younger brother recently became agitated and violent and the mother called social services to possibly get them both placed on the home. She is now reconsidering this and I suggested she look at other options such as getting more help in the home. Overall she thinks his medications are effective.  The mother returns for follow-up after 31 months for telemedicine consultation.  We have to do a phone visit as they do not have Internet and axis and the patient is nonverbal.  The mother states that they had gone back to his primary doctor, Dr. Mirna MiresGerald Hill, 2 weeks ago because Logan [name] was still losing weight.  He has infrequent bowel movements generally only 3-4 times a month.  Apparently he is down to 119 pounds and at one point he had been as high as 160.  The mother states that he is eating very well and now is emptying his bowels better as she made adjustments to his laxatives.  Apparently Dr. Loleta ChanceHill was able to get some labs done including thyroid panel but the mother has not yet heard the result.  Since this was done 2 weeks ago I urged her to call Dr. Adaline SillHill's office for the result.  She states that Logan [name] is sleeping well has a good appetite has not been severely agitated or combative. Visit Diagnosis:    ICD-10-CM  1. Autism  F84.0   2. Attention deficit hyperactivity disorder (ADHD), combined type  F90.2   3. Episodic mood disorder (HCC)  F39     Past Psychiatric History: Long-term outpatient treatment  Past Medical History:  Past Medical History:  Diagnosis Date  . ADHD (attention deficit hyperactivity disorder)   . Autism     Past Surgical History:  Procedure Laterality Date  . MOUTH SURGERY     FILLING/TOOTH REPAIR    Family Psychiatric History: See below  Family History:   Family History  Problem Relation Age of Onset  . Autism Brother   . Colon cancer Neg Hx   . Colon polyps Neg Hx     Social History:  Social History   Socioeconomic History  . Marital status: Single    Spouse name: Not on file  . Number of children: Not on file  . Years of education: Not on file  . Highest education level: Not on file  Occupational History  . Not on file  Social Needs  . Financial resource strain: Not on file  . Food insecurity    Worry: Not on file    Inability: Not on file  . Transportation needs    Medical: Not on file    Non-medical: Not on file  Tobacco Use  . Smoking status: Never Smoker  . Smokeless tobacco: Never Used  Substance and Sexual Activity  . Alcohol use: No  . Drug use: No  . Sexual activity: Never  Lifestyle  . Physical activity    Days per week: Not on file    Minutes per session: Not on file  . Stress: Not on file  Relationships  . Social Musician on phone: Not on file    Gets together: Not on file    Attends religious service: Not on file    Active member of club or organization: Not on file    Attends meetings of clubs or organizations: Not on file    Relationship status: Not on file  Other Topics Concern  . Not on file  Social History Narrative   DIAGNOSED WITH AUTISM AT AGE 66. HAS ANOTHER AUTISTIC BROTHER. MOTHER CARES FOR HIM.    Allergies: No Known Allergies  Metabolic Disorder Labs: No results found for: HGBA1C, MPG No results found for: PROLACTIN No results found for: CHOL, TRIG, HDL, CHOLHDL, VLDL, LDLCALC No results found for: TSH  Therapeutic Level Labs: No results found for: LITHIUM No results found for: VALPROATE No components found for:  CBMZ  Current Medications: Current Outpatient Medications  Medication Sig Dispense Refill  . amphetamine-dextroamphetamine (ADDERALL) 30 MG tablet Take 1 tablet by mouth every morning. 30 tablet 0  . amphetamine-dextroamphetamine (ADDERALL) 30 MG  tablet Take 1 tablet by mouth every morning. 30 tablet 0  . amphetamine-dextroamphetamine (ADDERALL) 30 MG tablet Take 1 tablet by mouth every morning. 30 tablet 0  . clonazepam (KLONOPIN) 2 MG disintegrating tablet Take 1 tablet (2 mg total) by mouth at bedtime. 30 tablet 2  . cloNIDine (CATAPRES - DOSED IN MG/24 HR) 0.2 mg/24hr patch Place 0.2 mg onto the skin once a week.    . diphenhydrAMINE (BENADRYL) 12.5 MG/5ML elixir Take 12.5 mg by mouth 4 (four) times daily as needed.    . docusate sodium (COLACE) 100 MG capsule Take 1 capsule (100 mg total) by mouth every 12 (twelve) hours. (Patient not taking: Reported on 02/01/2018) 60 capsule 1  . linaclotide (LINZESS) 145 MCG CAPS  capsule Take 145 mcg by mouth daily before breakfast. *Opens capsule and administers    . LORazepam (ATIVAN) 1 MG tablet Take one the night before procedure and one the am of dental procedure 2 tablet 0  . mometasone (ELOCON) 0.1 % cream Apply 1 application topically as needed.    Marland Kitchen OLANZapine zydis (ZYPREXA) 20 MG disintegrating tablet Take 1 tablet (20 mg total) by mouth 4 (four) times daily. 120 tablet 2  . polyethylene glycol (MIRALAX / GLYCOLAX) packet Take 17 g by mouth daily. 14 each 0  . traZODone (DESYREL) 100 MG tablet Take 1 tablet (100 mg total) by mouth at bedtime as needed for sleep. 30 tablet 2   No current facility-administered medications for this visit.      Musculoskeletal: Strength & Muscle Tone: within normal limits Gait & Station: normal Patient leans: N/A  Psychiatric Specialty Exam: Review of Systems  Constitutional: Positive for weight loss.  Gastrointestinal: Positive for constipation.  All other systems reviewed and are negative.   There were no vitals taken for this visit.There is no height or weight on file to calculate BMI.  General Appearance: NA  Eye Contact:  NA  Speech:  no speech  Volume:  no speech  Mood:  NA  Affect:  NA  Thought Process:  NA  Orientation:  NA  Thought  Content: NA   Suicidal Thoughts:  No  Homicidal Thoughts:  No  Memory:  Immediate;   Poor Recent;   Poor Remote;   Poor  Judgement:  Impaired  Insight:  Lacking  Psychomotor Activity:  Mannerisms  Concentration:  Concentration: Fair and Attention Span: Fair  Recall:  NA  Fund of Knowledge: Poor  Language: Poor  Akathisia:  No  Handed:  Right  AIMS (if indicated): not done  Assets:  Resilience Social Support  ADL's:  Intact  Cognition: Impaired,  Moderate  Sleep:  Good   Screenings:   Assessment and Plan: This patient is a 33 year old male with cognitive impairment autism ADHD and agitation.  He has been struggling with encopresis and constipation.  At one point he was seeing Dr. Oneida Alar, GI specialist and I advised mother to go back to her but she needs referral from primary care.  I am also concerned about his weight loss and anxious to the results of his lab work.  I cannot pull them up on the [facility] health website.  For now he will continue Adderall 30 mg every morning for ADHD, clonazepam 2 mg disintegrating tablet at bedtime for sleep, Zyprexa Zydis 20 mg 4 times daily for agitation and trazodone 100 mg at bedtime only if needed for sleep.  He will return to see me in 3 months   Levonne Spiller, MD 01/16/2019, 2:40 PM

## 2019-04-10 ENCOUNTER — Other Ambulatory Visit (HOSPITAL_COMMUNITY): Payer: Self-pay | Admitting: Psychiatry

## 2019-04-17 ENCOUNTER — Ambulatory Visit (INDEPENDENT_AMBULATORY_CARE_PROVIDER_SITE_OTHER): Payer: Medicare Other | Admitting: Psychiatry

## 2019-04-17 ENCOUNTER — Other Ambulatory Visit: Payer: Self-pay

## 2019-04-17 ENCOUNTER — Encounter (HOSPITAL_COMMUNITY): Payer: Self-pay | Admitting: Psychiatry

## 2019-04-17 DIAGNOSIS — F840 Autistic disorder: Secondary | ICD-10-CM | POA: Diagnosis not present

## 2019-04-17 DIAGNOSIS — F902 Attention-deficit hyperactivity disorder, combined type: Secondary | ICD-10-CM

## 2019-04-17 DIAGNOSIS — F39 Unspecified mood [affective] disorder: Secondary | ICD-10-CM

## 2019-04-17 MED ORDER — OLANZAPINE 20 MG PO TBDP: 20.0000 mg | ORAL_TABLET | Freq: Four times a day (QID) | ORAL | 2 refills | Status: DC

## 2019-04-17 MED ORDER — CLONAZEPAM 2 MG PO TBDP: 2.0000 mg | ORAL_TABLET | Freq: Every day | ORAL | 2 refills | Status: DC

## 2019-04-17 MED ORDER — TRAZODONE HCL 100 MG PO TABS: 100.0000 mg | ORAL_TABLET | Freq: Every evening | ORAL | 2 refills | Status: DC | PRN

## 2019-04-17 MED ORDER — AMPHETAMINE-DEXTROAMPHETAMINE 10 MG PO TABS: 10.0000 mg | ORAL_TABLET | ORAL | 0 refills | Status: DC

## 2019-04-17 NOTE — Progress Notes (Signed)
Virtual Visit via Telephone Note  I connected with Logan [name] on 04/17/19 at  1:40 PM EST by telephone and verified that I am speaking with the correct person using two identifiers.   I discussed the limitations, risks, security and privacy concerns of performing an evaluation and management service by telephone and the availability of in person appointments. I also discussed with the patient that there may be a patient responsible charge related to this service. The patient expressed understanding and agreed to proceed    I discussed the assessment and treatment plan with the patient. The patient was provided an opportunity to ask questions and all were answered. The patient agreed with the plan and demonstrated an understanding of the instructions.   The patient was advised to call back or seek an in-person evaluation if the symptoms worsen or if the condition fails to improve as anticipated.  I provided 15 minutes of non-face-to-face time during this encounter.   [name], MD  [facility] MD/PA/NP OP Progress Note  04/17/2019 1:59 PM Logan [name]  MRN:  [id number]  Chief Complaint:  Chief Complaint    ADHD; Agitation; Anxiety; Follow-up     XHB:[id number] patient is a34 year old single black male who lives with his mother in [place]. He has completed the development disability program at [employer] and has nothing to do to occupy his time. The patienthad been seenin our [facility] but the mother wanted to bring him here to establish care.  The patient has been autistic since a young age. Apparently he developed normally originally but began regressing around age 34 and lost his speech. He was always a very strong aggressive child. He's been in special education classes all his life. Unfortunately he's never been totally potty trained and still wears a diaper because he defecates in his pants. This is made it difficult to get them in any special needs program's  or to get him workers. He also has problems with focus but has had a pretty good response to Adderall. He walks around around in a posturing position with both arms flexed and pulling his fingers. He drools a lot. His younger brother recently became agitated and violent and the mother called social services to possibly get them both placed on the home. She is now reconsidering this and I suggested she look at other options such as getting more help in the home. Overall she thinks his medications are effective.  The patient's mom returns after 3 months to report on the patient.  He is nonverbal and cannot communicate on the phone and they do not have good Internet access.  Last time she was telling me about his weight loss and we discussed the fact that Adderall sometimes cause appetite suppression.  She decided to start cutting it back and he is now down to 10 mg daily.  He is gradually gaining his weight back and is up to 130 pounds.  At one point he was down as low as 119 pounds.  Also on the lower dose that she is more interactive and pleasant with people and will sit in the same room with his relatives.  He is still sleeping well and he has not been significantly agitated or out of control.  His constipation is also better.  It is interesting that after quite a number of years the Adderall began to have this effective appetite suppression but nevertheless it seems to be working well and has not made any change in his  behavior except for +ones.  Visit Diagnosis:    ICD-10-CM   1. Autism  F84.0   2. Attention deficit hyperactivity disorder (ADHD), combined type  F90.2   3. Episodic mood disorder (HCC)  F39     Past Psychiatric History: Long-term outpatient treatment  Past Medical History:  Past Medical History:  Diagnosis Date  . ADHD (attention deficit hyperactivity disorder)   . Autism     Past Surgical History:  Procedure Laterality Date  . MOUTH SURGERY     FILLING/TOOTH REPAIR     Family Psychiatric History: see below  Family History:  Family History  Problem Relation Age of Onset  . Autism Brother   . Colon cancer Neg Hx   . Colon polyps Neg Hx     Social History:  Social History   Socioeconomic History  . Marital status: Single    Spouse name: Not on file  . Number of children: Not on file  . Years of education: Not on file  . Highest education level: Not on file  Occupational History  . Not on file  Tobacco Use  . Smoking status: Never Smoker  . Smokeless tobacco: Never Used  Substance and Sexual Activity  . Alcohol use: No  . Drug use: No  . Sexual activity: Never  Other Topics Concern  . Not on file  Social History Narrative   DIAGNOSED WITH AUTISM AT AGE 34. HAS ANOTHER AUTISTIC BROTHER. MOTHER CARES FOR HIM.   Social Determinants of Health   Financial Resource Strain:   . Difficulty of Paying Living Expenses: Not on file  Food Insecurity:   . Worried About Charity fundraiser in the Last Year: Not on file  . Ran Out of Food in the Last Year: Not on file  Transportation Needs:   . Lack of Transportation (Medical): Not on file  . Lack of Transportation (Non-Medical): Not on file  Physical Activity:   . Days of Exercise per Week: Not on file  . Minutes of Exercise per Session: Not on file  Stress:   . Feeling of Stress : Not on file  Social Connections:   . Frequency of Communication with Friends and Family: Not on file  . Frequency of Social Gatherings with Friends and Family: Not on file  . Attends Religious Services: Not on file  . Active Member of Clubs or Organizations: Not on file  . Attends Archivist Meetings: Not on file  . Marital Status: Not on file    Allergies: No Known Allergies  Metabolic Disorder Labs: No results found for: HGBA1C, MPG No results found for: PROLACTIN No results found for: CHOL, TRIG, HDL, CHOLHDL, VLDL, LDLCALC No results found for: TSH  Therapeutic Level Labs: No results  found for: LITHIUM No results found for: VALPROATE No components found for:  CBMZ  Current Medications: Current Outpatient Medications  Medication Sig Dispense Refill  . amphetamine-dextroamphetamine (ADDERALL) 10 MG tablet Take 1 tablet (10 mg total) by mouth every morning. 30 tablet 0  . amphetamine-dextroamphetamine (ADDERALL) 10 MG tablet Take 1 tablet (10 mg total) by mouth every morning. 30 tablet 0  . amphetamine-dextroamphetamine (ADDERALL) 10 MG tablet Take 1 tablet (10 mg total) by mouth every morning. 30 tablet 0  . clonazepam (KLONOPIN) 2 MG disintegrating tablet Take 1 tablet (2 mg total) by mouth at bedtime. 30 tablet 2  . cloNIDine (CATAPRES - DOSED IN MG/24 HR) 0.2 mg/24hr patch Place 0.2 mg onto the skin once a week.    Marland Kitchen  diphenhydrAMINE (BENADRYL) 12.5 MG/5ML elixir Take 12.5 mg by mouth 4 (four) times daily as needed.    . docusate sodium (COLACE) 100 MG capsule Take 1 capsule (100 mg total) by mouth every 12 (twelve) hours. (Patient not taking: Reported on 02/01/2018) 60 capsule 1  . linaclotide (LINZESS) 145 MCG CAPS capsule Take 145 mcg by mouth daily before breakfast. *Opens capsule and administers    . LORazepam (ATIVAN) 1 MG tablet Take one the night before procedure and one the am of dental procedure 2 tablet 0  . mometasone (ELOCON) 0.1 % cream Apply 1 application topically as needed.    Marland Kitchen OLANZapine zydis (ZYPREXA) 20 MG disintegrating tablet Take 1 tablet (20 mg total) by mouth 4 (four) times daily. 120 tablet 2  . polyethylene glycol (MIRALAX / GLYCOLAX) packet Take 17 g by mouth daily. 14 each 0  . traZODone (DESYREL) 100 MG tablet Take 1 tablet (100 mg total) by mouth at bedtime as needed. for sleep 30 tablet 2   No current facility-administered medications for this visit.     Musculoskeletal: Strength & Muscle Tone: within normal limits Gait & Station: normal Patient leans: N/A  Psychiatric Specialty Exam: Review of Systems  Psychiatric/Behavioral:  Positive for agitation.  All other systems reviewed and are negative.   There were no vitals taken for this visit.There is no height or weight on file to calculate BMI.  General Appearance: NA  Eye Contact:  NA  Speech:  nonverbal  Volume:  nonverbal  Mood:  Euthymic  Affect:  NA  Thought Process:  NA  Orientation:  NA  Thought Content: NA   Suicidal Thoughts:  No  Homicidal Thoughts:  No  Memory:  NA  Judgement:  Impaired  Insight:  Lacking  Psychomotor Activity:  Normal  Concentration:  Concentration: NA and Attention Span: NA  Recall:  NA  Fund of Knowledge: Poor  Language: none  Akathisia:  No  Handed:  Right  AIMS (if indicated): not done  Assets:  Physical Health Resilience Social Support  ADL's:  Intact  Cognition: Impaired,  Moderate  Sleep:  Good   Screenings:   Assessment and Plan: This patient is a 34 year old male with a history of cognitive impairment also has some ADHD and agitation.  He is doing much better in terms of appetite and connectivity to others with a lower dose of Adderall therefore for now he will continue Adderall 10 mg only in the morning for ADHD, continue clonazepam 2 mg disintegrating tablet at bedtime for anxiety and sleep, Zyprexa Zydis 20 mg 4 times daily for agitation and trazodone 100 mg at bedtime only if needed for sleep.  He and his mother will return to see me in 3 months   [name], MD 04/17/2019, 1:59 PM

## 2019-05-07 DIAGNOSIS — I10 Essential (primary) hypertension: Secondary | ICD-10-CM | POA: Diagnosis not present

## 2019-05-07 DIAGNOSIS — F840 Autistic disorder: Secondary | ICD-10-CM | POA: Diagnosis not present

## 2019-05-07 DIAGNOSIS — K5901 Slow transit constipation: Secondary | ICD-10-CM | POA: Diagnosis not present

## 2019-05-08 ENCOUNTER — Telehealth (HOSPITAL_COMMUNITY): Payer: Self-pay | Admitting: Psychiatry

## 2019-05-08 NOTE — Telephone Encounter (Signed)
Dr. Tenny Craw pt's mother called to say Anthem had an appt w/ his PCP and that he gained 4 more pounds (134ilb) and that it was the medication (adderall). Also, his blood pressure was very good.

## 2019-07-18 ENCOUNTER — Encounter (HOSPITAL_COMMUNITY): Payer: Self-pay | Admitting: Psychiatry

## 2019-07-18 ENCOUNTER — Other Ambulatory Visit: Payer: Self-pay

## 2019-07-18 ENCOUNTER — Telehealth (INDEPENDENT_AMBULATORY_CARE_PROVIDER_SITE_OTHER): Payer: Medicare Other | Admitting: Psychiatry

## 2019-07-18 DIAGNOSIS — F840 Autistic disorder: Secondary | ICD-10-CM

## 2019-07-18 DIAGNOSIS — F39 Unspecified mood [affective] disorder: Secondary | ICD-10-CM | POA: Diagnosis not present

## 2019-07-18 MED ORDER — TRAZODONE HCL 100 MG PO TABS: 100.0000 mg | ORAL_TABLET | Freq: Every evening | ORAL | 2 refills | Status: DC | PRN

## 2019-07-18 MED ORDER — CLONAZEPAM 2 MG PO TBDP: 2.0000 mg | ORAL_TABLET | Freq: Every day | ORAL | 2 refills | Status: DC

## 2019-07-18 MED ORDER — OLANZAPINE 20 MG PO TBDP: 20.0000 mg | ORAL_TABLET | Freq: Four times a day (QID) | ORAL | 2 refills | Status: DC

## 2019-07-18 NOTE — Progress Notes (Signed)
Virtual Visit via Telephone Note  I connected with [place] on 07/18/19 at 10:00 AM EDT by telephone and verified that I am speaking with the correct person using two identifiers.   I discussed the limitations, risks, security and privacy concerns of performing an evaluation and management service by telephone and the availability of in person appointments. I also discussed with the patient that there may be a patient responsible charge related to this service. The patient expressed understanding and agreed to proceed.     I discussed the assessment and treatment plan with the patient. The patient was provided an opportunity to ask questions and all were answered. The patient agreed with the plan and demonstrated an understanding of the instructions.   The patient was advised to call back or seek an in-person evaluation if the symptoms worsen or if the condition fails to improve as anticipated.  I provided 15 minutes of non-face-to-face time during this encounter. Location: Provider office, patient home  Levonne Spiller, MD  [facility] MD/PA/NP OP Progress Note  07/18/2019 10:13 AM Logan [name]  MRN:  [id number]  Chief Complaint:  Chief Complaint    Anxiety; Agitation; Follow-up     TGG:YIRS patient is a34 year old single black male who lives with his mother in [place]. He has completed the development disability program at [employer] and has nothing to do to occupy his time. The patienthad been seenin our [facility] clinic but the mother wanted to bring him here to establish care.  The patient has been autistic since a young age. Apparently he developed normally originally but began regressing around age 34 and lost his speech. He was always a very strong aggressive child. He's been in special education classes all his life. Unfortunately he's never been totally potty trained and still wears a diaper because he defecates in his pants. This is made it difficult to get  them in any special needs program's or to get him workers. He also has problems with focus but has had a pretty good response to Adderall. He walks around around in a posturing position with both arms flexed and pulling his fingers. He drools a lot. His younger brother recently became agitated and violent and the mother called social services to possibly get them both placed on the home. She is now reconsidering this and I suggested she look at other options such as getting more help in the home. Overall she thinks his medications are effective.  Patient mother returns after 3 months report on the patient.  He has no speech and cannot participate in phone call and they do not have the ability to do a telemedicine visit.  She states that he is actually doing much better.  She has totally stopped the Adderall as it seems to have caused him to lose significant weight.  He now is eating well and is a better mood and is more affectionate.  He wants his mother to kiss him on the cheek every day.  He spends more time in her vicinity and even dances along with her to [employer].  He is sleeping well at night and only rarely needs to take the trazodone.  He has not been violent or agitated.  She thinks he is making progress to becoming a little bit more social. Visit Diagnosis:    ICD-10-CM   1. Autism  F84.0   2. Episodic mood disorder (HCC)  F39     Past Psychiatric History: Long-term outpatient treatment  Past  Medical History:  Past Medical History:  Diagnosis Date  . ADHD (attention deficit hyperactivity disorder)   . Autism     Past Surgical History:  Procedure Laterality Date  . MOUTH SURGERY     FILLING/TOOTH REPAIR    Family Psychiatric History: see below  Family History:  Family History  Problem Relation Age of Onset  . Autism Brother   . Colon cancer Neg Hx   . Colon polyps Neg Hx     Social History:  Social History   Socioeconomic History  . Marital status: Single    Spouse  name: Not on file  . Number of children: Not on file  . Years of education: Not on file  . Highest education level: Not on file  Occupational History  . Not on file  Tobacco Use  . Smoking status: Never Smoker  . Smokeless tobacco: Never Used  Substance and Sexual Activity  . Alcohol use: No  . Drug use: No  . Sexual activity: Never  Other Topics Concern  . Not on file  Social History Narrative   DIAGNOSED WITH AUTISM AT AGE 34. HAS ANOTHER AUTISTIC BROTHER. MOTHER CARES FOR HIM.   Social Determinants of Health   Financial Resource Strain:   . Difficulty of Paying Living Expenses:   Food Insecurity:   . Worried About Programme researcher, broadcasting/film/video in the Last Year:   . Barista in the Last Year:   Transportation Needs:   . Freight forwarder (Medical):   Marland Kitchen Lack of Transportation (Non-Medical):   Physical Activity:   . Days of Exercise per Week:   . Minutes of Exercise per Session:   Stress:   . Feeling of Stress :   Social Connections:   . Frequency of Communication with Friends and Family:   . Frequency of Social Gatherings with Friends and Family:   . Attends Religious Services:   . Active Member of Clubs or Organizations:   . Attends Banker Meetings:   Marland Kitchen Marital Status:     Allergies: No Known Allergies  Metabolic Disorder Labs: No results found for: HGBA1C, MPG No results found for: PROLACTIN No results found for: CHOL, TRIG, HDL, CHOLHDL, VLDL, LDLCALC No results found for: TSH  Therapeutic Level Labs: No results found for: LITHIUM No results found for: VALPROATE No components found for:  CBMZ  Current Medications: Current Outpatient Medications  Medication Sig Dispense Refill  . clonazepam (KLONOPIN) 2 MG disintegrating tablet Take 1 tablet (2 mg total) by mouth at bedtime. 30 tablet 2  . cloNIDine (CATAPRES - DOSED IN MG/24 HR) 0.2 mg/24hr patch Place 0.2 mg onto the skin once a week.    . diphenhydrAMINE (BENADRYL) 12.5 MG/5ML elixir  Take 12.5 mg by mouth 4 (four) times daily as needed.    . docusate sodium (COLACE) 100 MG capsule Take 1 capsule (100 mg total) by mouth every 12 (twelve) hours. (Patient not taking: Reported on 02/01/2018) 60 capsule 1  . linaclotide (LINZESS) 145 MCG CAPS capsule Take 145 mcg by mouth daily before breakfast. *Opens capsule and administers    . LORazepam (ATIVAN) 1 MG tablet Take one the night before procedure and one the am of dental procedure 2 tablet 0  . mometasone (ELOCON) 0.1 % cream Apply 1 application topically as needed.    Marland Kitchen OLANZapine zydis (ZYPREXA) 20 MG disintegrating tablet Take 1 tablet (20 mg total) by mouth 4 (four) times daily. 120 tablet 2  . polyethylene  glycol (MIRALAX / GLYCOLAX) packet Take 17 g by mouth daily. 14 each 0  . traZODone (DESYREL) 100 MG tablet Take 1 tablet (100 mg total) by mouth at bedtime as needed. for sleep 30 tablet 2   No current facility-administered medications for this visit.     Musculoskeletal: Strength & Muscle Tone: within normal limits Gait & Station: normal Patient leans: N/A  Psychiatric Specialty Exam: Review of Systems  Psychiatric/Behavioral: Positive for agitation and behavioral problems.  All other systems reviewed and are negative.   There were no vitals taken for this visit.There is no height or weight on file to calculate BMI.  General Appearance: NA  Eye Contact:  NA  Speech:  no speech  Volume:  none  Mood:  Euthymic  Affect:  NA  Thought Process:  NA  Orientation:  NA  Thought Content: NA   Suicidal Thoughts:  No  Homicidal Thoughts:  No  Memory:  NA  Judgement:  Impaired  Insight:  Lacking  Psychomotor Activity:  Mannerisms and Restlessness  Concentration:  Concentration: Fair and Attention Span: Fair  Recall:  NA  Fund of Knowledge: Poor  Language: none  Akathisia:  No  Handed:  Right  AIMS (if indicated): not done  Assets:  Physical Health Resilience Social Support  ADL's:  Impaired  Cognition:  Impaired,  Moderate  Sleep:  Good   Screenings:   Assessment and Plan: This patient is a 34 year old male with a history of cognitive impairment ADHD and agitation.  He is doing well without the Adderall.  He will continue Zyprexa Zydis 20 mg 4 times daily for agitation, clonazepam 2 mg disintegrating tablet at bedtime for anxiety and sleep and trazodone 100 mg at bedtime only if needed for sleep.  He and his mother will return to see me in 3 months   [name], MD 07/18/2019, 10:13 AM

## 2019-10-13 IMAGING — DX DG ABDOMEN 1V
2 series · 2 of 2 positions shown · non-contrast
Comparison: None.

CLINICAL DATA: Constipation for several weeks

EXAM:
ABDOMEN - 1 VIEW

[abdomen kub (1 of 2)]
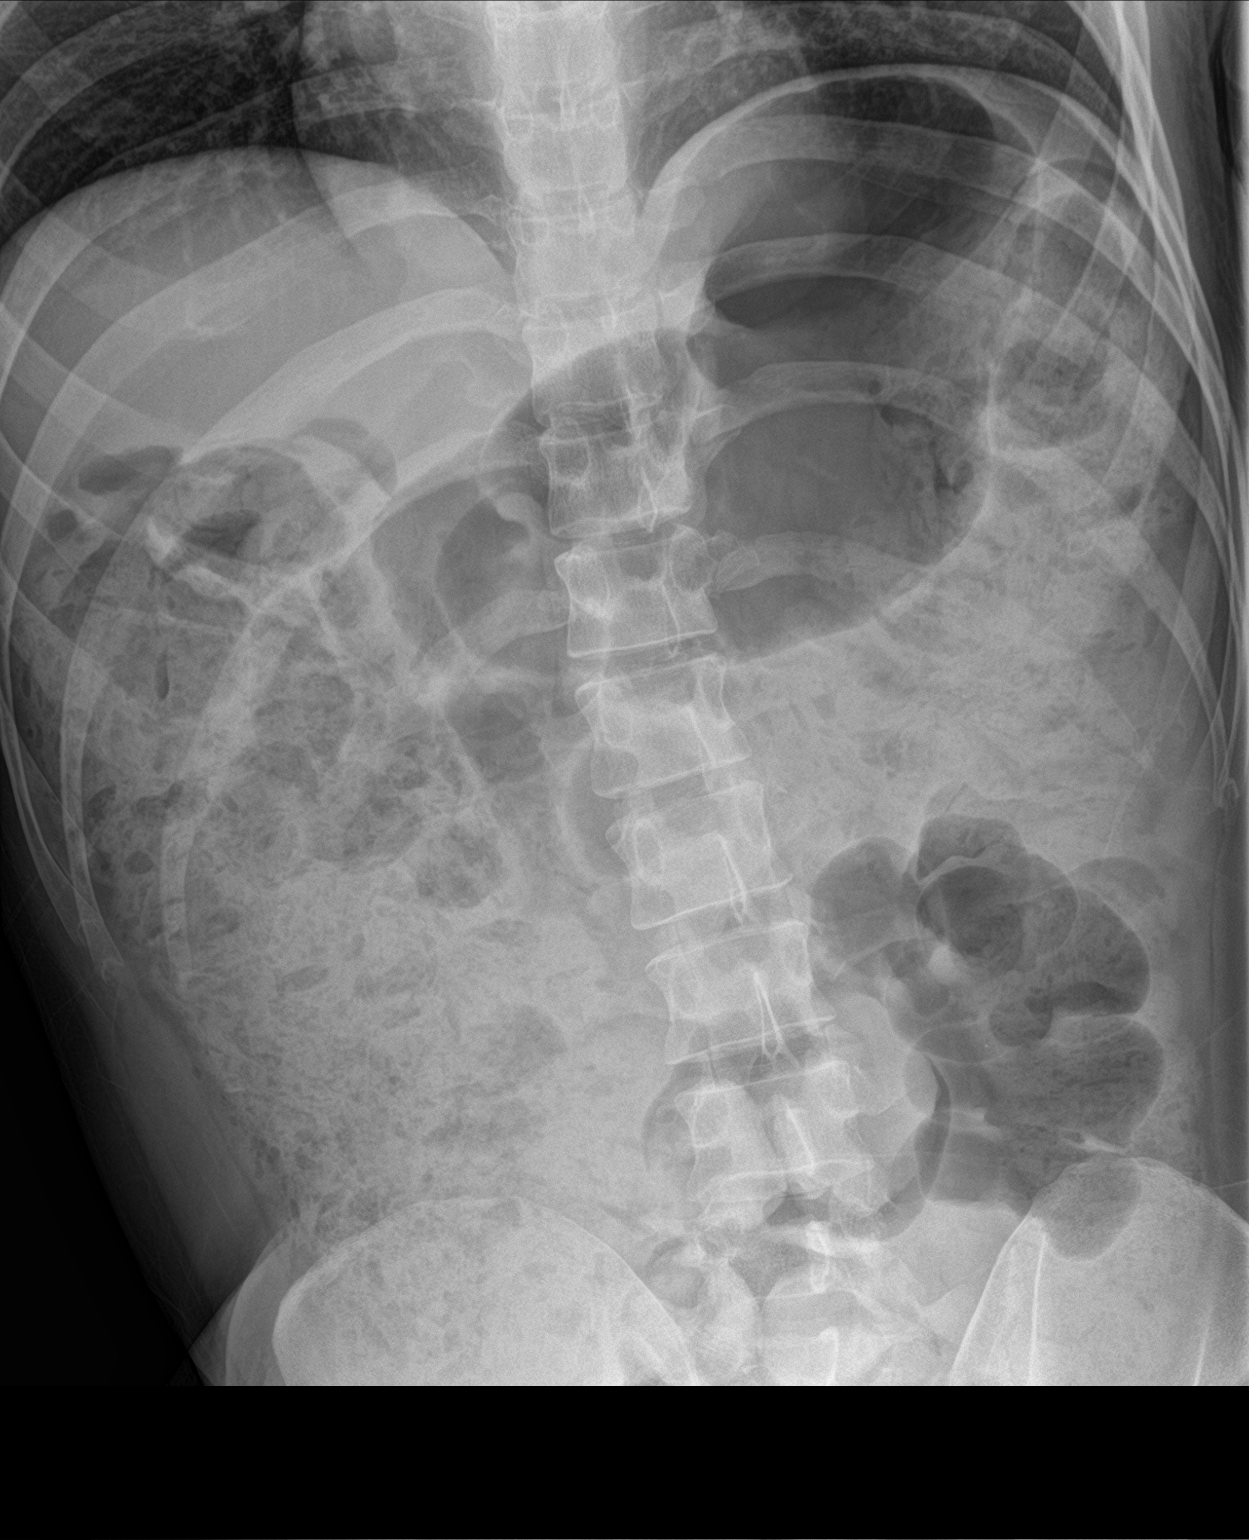

[abdomen kub (2 of 2)]
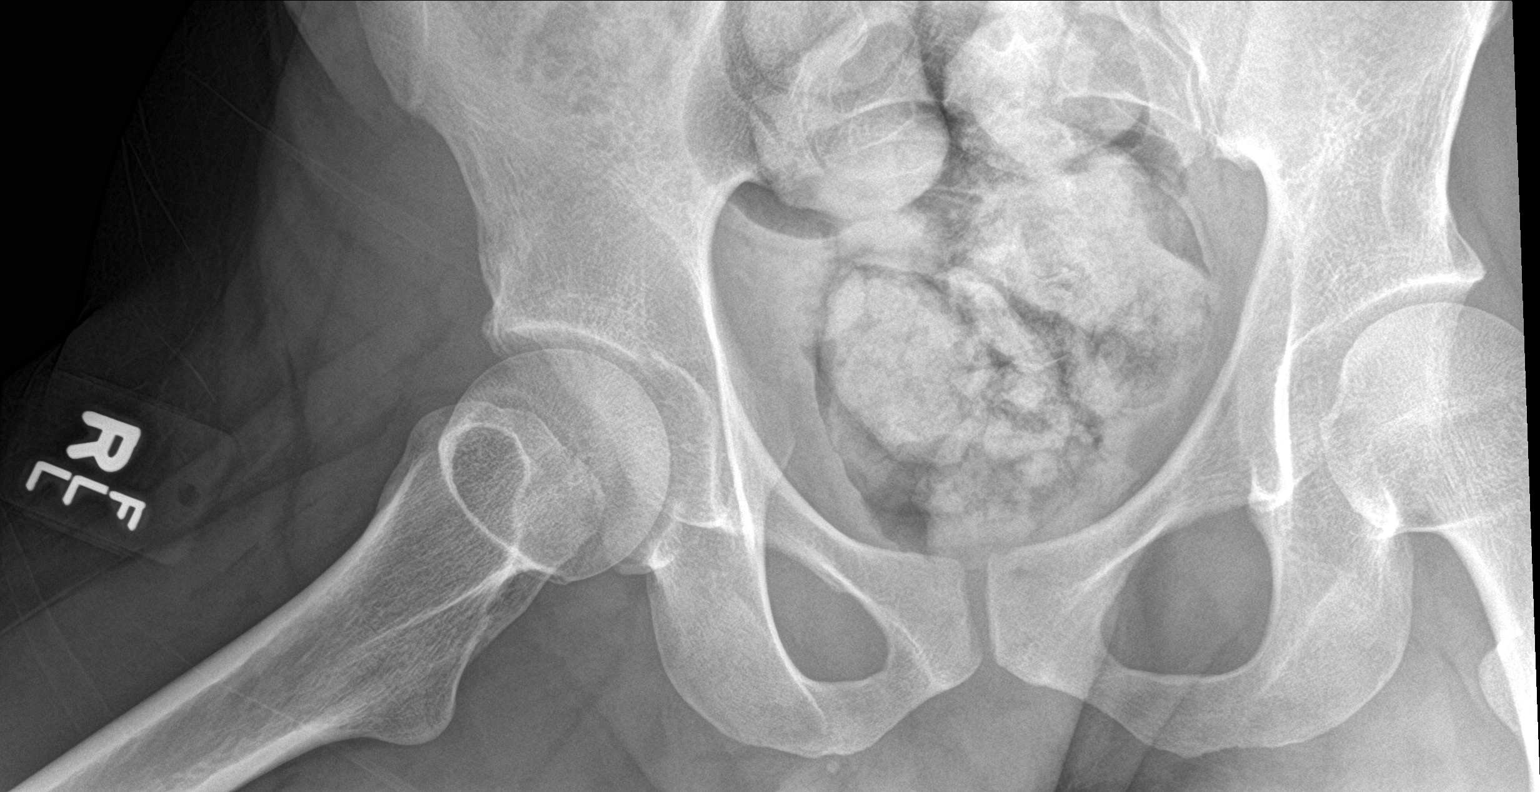

[2 of 2 positions shown; findings below may reference images not displayed]

FINDINGS: Scattered large and small bowel gas is noted. Considerable retained
fecal material is noted throughout the colon primarily within the
right colon consistent with constipation. No obstructive changes are
seen. No free air is noted. No bony abnormality is seen.
IMPRESSION: Considerable retained fecal material primarily within the right
colon consistent with constipation.

## 2019-10-18 ENCOUNTER — Other Ambulatory Visit: Payer: Self-pay

## 2019-10-18 ENCOUNTER — Telehealth (INDEPENDENT_AMBULATORY_CARE_PROVIDER_SITE_OTHER): Payer: Medicare Other | Admitting: Psychiatry

## 2019-10-18 ENCOUNTER — Encounter (HOSPITAL_COMMUNITY): Payer: Self-pay | Admitting: Psychiatry

## 2019-10-18 DIAGNOSIS — F840 Autistic disorder: Secondary | ICD-10-CM | POA: Diagnosis not present

## 2019-10-18 DIAGNOSIS — F39 Unspecified mood [affective] disorder: Secondary | ICD-10-CM | POA: Diagnosis not present

## 2019-10-18 MED ORDER — CLONAZEPAM 2 MG PO TBDP: 2.0000 mg | ORAL_TABLET | Freq: Every day | ORAL | 2 refills | Status: DC

## 2019-10-18 MED ORDER — OLANZAPINE 20 MG PO TBDP: 20.0000 mg | ORAL_TABLET | Freq: Four times a day (QID) | ORAL | 2 refills | Status: DC

## 2019-10-18 NOTE — Progress Notes (Signed)
Virtual Visit via Telephone Note  I connected with Logan [name] on 10/18/19 at 11:00 AM EDT by telephone and verified that I am speaking with the correct person using two identifiers.   I discussed the limitations, risks, security and privacy concerns of performing an evaluation and management service by telephone and the availability of in person appointments. I also discussed with the patient that there may be a patient responsible charge related to this service. The patient expressed understanding and agreed to proceed.     I discussed the assessment and treatment plan with the patient. The patient was provided an opportunity to ask questions and all were answered. The patient agreed with the plan and demonstrated an understanding of the instructions.   The patient was advised to call back or seek an in-person evaluation if the symptoms worsen or if the condition fails to improve as anticipated.  I provided 15 minutes of non-face-to-face time during this encounter. Patient: Provider Home, patient home  [name], MD  [facility] MD/PA/NP OP Progress Note  10/18/2019 11:06 AM Logan [name]  MRN:  [id number]  Chief Complaint:  Chief Complaint    Agitation; Follow-up     HPI: This patient is a34 year old single black male who lives with his mother in [place]. He has completed the development disability program at [employer] and has nothing to do to occupy his time. The [facility]had been [facility]in [facility] but the mother wanted to bring him here to establish care.  The patient has been autistic since a young age. Apparently he developed normally originally but began regressing around age 20 and lost his speech. He was always a very strong aggressive child. He's been in special education classes all his life. Unfortunately he's never been totally potty trained and still wears a diaper because he defecates in his pants. This is made it difficult to get them in any  special needs program's or to get him workers. He also has problems with focus but has had a pretty good response to Adderall. He walks around around in a posturing position with both arms flexed and pulling his fingers. He drools a lot. His younger brother recently became agitated and violent and the mother called social services to possibly get them both placed on the home. She is now reconsidering this and I suggested she look at other options such as getting more help in the home. Overall she thinks his medications are effective.  The patient's mother returns after 3 months to report on the patient.  He has no speech or contractures chest pain and phone call and he did not have the ability to do a telemedicine visit.  He is totally off Adderall and now he is eating very well.  His appetite is great according to mom.  He still has difficulties with constipation but they are trying to manage this through his primary care physician.  He has not been agitated or violent and is sleeping well without trazodone.  He still interacts and is very affectionate with his mother but does not interact much with other people.  He will stand in the doorway while his mother converses with neighbors.  She thinks he is very stable at this point Visit Diagnosis:    ICD-10-CM   1. Autism  F84.0   2. Episodic mood disorder (HCC)  F39     Past Psychiatric History: Long-term outpatient treatment  Past Medical History:  Past Medical History:  Diagnosis Date  .  ADHD (attention deficit hyperactivity disorder)   . Autism     Past Surgical History:  Procedure Laterality Date  . MOUTH SURGERY     FILLING/TOOTH REPAIR    Family Psychiatric History: see below  Family History:  Family History  Problem Relation Age of Onset  . Autism Brother   . Colon cancer Neg Hx   . Colon polyps Neg Hx     Social History:  Social History   Socioeconomic History  . Marital status: Single    Spouse name: Not on file  .  Number of children: Not on file  . Years of education: Not on file  . Highest education level: Not on file  Occupational History  . Not on file  Tobacco Use  . Smoking status: Never Smoker  . Smokeless tobacco: Never Used  Substance and Sexual Activity  . Alcohol use: No  . Drug use: No  . Sexual activity: Never  Other Topics Concern  . Not on file  Social History Narrative   DIAGNOSED WITH AUTISM AT AGE 34. HAS ANOTHER AUTISTIC BROTHER. MOTHER CARES FOR HIM.   Social Determinants of Health   Financial Resource Strain:   . Difficulty of Paying Living Expenses: Not on file  Food Insecurity:   . Worried About Programme researcher, broadcasting/film/video in the Last Year: Not on file  . Ran Out of Food in the Last Year: Not on file  Transportation Needs:   . Lack of Transportation (Medical): Not on file  . Lack of Transportation (Non-Medical): Not on file  Physical Activity:   . Days of Exercise per Week: Not on file  . Minutes of Exercise per Session: Not on file  Stress:   . Feeling of Stress : Not on file  Social Connections:   . Frequency of Communication with Friends and Family: Not on file  . Frequency of Social Gatherings with Friends and Family: Not on file  . Attends Religious Services: Not on file  . Active Member of Clubs or Organizations: Not on file  . Attends Banker Meetings: Not on file  . Marital Status: Not on file    Allergies: No Known Allergies  Metabolic Disorder Labs: No results found for: HGBA1C, MPG No results found for: PROLACTIN No results found for: CHOL, TRIG, HDL, CHOLHDL, VLDL, LDLCALC No results found for: TSH  Therapeutic Level Labs: No results found for: LITHIUM No results found for: VALPROATE No components found for:  CBMZ  Current Medications: Current Outpatient Medications  Medication Sig Dispense Refill  . clonazepam (KLONOPIN) 2 MG disintegrating tablet Take 1 tablet (2 mg total) by mouth at bedtime. 30 tablet 2  . cloNIDine (CATAPRES -  DOSED IN MG/24 HR) 0.2 mg/24hr patch Place 0.2 mg onto the skin once a week.    . diphenhydrAMINE (BENADRYL) 12.5 MG/5ML elixir Take 12.5 mg by mouth 4 (four) times daily as needed.    . docusate sodium (COLACE) 100 MG capsule Take 1 capsule (100 mg total) by mouth every 12 (twelve) hours. (Patient not taking: Reported on 02/01/2018) 60 capsule 1  . linaclotide (LINZESS) 145 MCG CAPS capsule Take 145 mcg by mouth daily before breakfast. *Opens capsule and administers    . LORazepam (ATIVAN) 1 MG tablet Take one the night before procedure and one the am of dental procedure 2 tablet 0  . mometasone (ELOCON) 0.1 % cream Apply 1 application topically as needed.    Marland Kitchen OLANZapine zydis (ZYPREXA) 20 MG disintegrating tablet Take  1 tablet (20 mg total) by mouth 4 (four) times daily. 120 tablet 2  . polyethylene glycol (MIRALAX / GLYCOLAX) packet Take 17 g by mouth daily. 14 each 0   No current facility-administered medications for this visit.     Musculoskeletal: Strength & Muscle Tone: within normal limits Gait & Station: normal Patient leans: N/A  Psychiatric Specialty Exam: Review of Systems  Gastrointestinal: Positive for constipation.  Psychiatric/Behavioral: Negative for behavioral problems.  All other systems reviewed and are negative.   There were no vitals taken for this visit.There is no height or weight on file to calculate BMI.  General Appearance: NA  Eye Contact:  NA  Speech:  no speech  Volume:  no speech  Mood:  Euthymic  Affect:  NA  Thought Process:  NA  Orientation:  NA  Thought Content: NA   Suicidal Thoughts:  No  Homicidal Thoughts:  No  Memory:  NA  Judgement:  Impaired  Insight:  Lacking  Psychomotor Activity:  Mannerisms  Concentration:  Concentration: Fair and Attention Span: Fair  Recall:  NA  Fund of Knowledge: Poor  Language: none  Akathisia:  No  Handed:  Right  AIMS (if indicated): not done  Assets:  Physical Health Resilience Social Support   ADL's:  Impaired  Cognition: Impaired,  Moderate  Sleep:  Good   Screenings:   Assessment and Plan: This patient is a 34 year old male with a history of cognitive impairment ADHD and agitation.  He is doing well without Adderall or trazodone.  He will continue his Zyprexa Zydis 20 mg 4 times daily for agitation and clonazepam 2 mg disintegrating tablet at bedtime for anxiety and sleep.  He will return to see me with his mother in 98 months   [name], MD 10/18/2019, 11:06 AM

## 2020-01-17 ENCOUNTER — Telehealth (INDEPENDENT_AMBULATORY_CARE_PROVIDER_SITE_OTHER): Payer: Medicare Other | Admitting: Psychiatry

## 2020-01-17 ENCOUNTER — Encounter (HOSPITAL_COMMUNITY): Payer: Self-pay | Admitting: Psychiatry

## 2020-01-17 ENCOUNTER — Other Ambulatory Visit: Payer: Self-pay

## 2020-01-17 DIAGNOSIS — F840 Autistic disorder: Secondary | ICD-10-CM

## 2020-01-17 DIAGNOSIS — F39 Unspecified mood [affective] disorder: Secondary | ICD-10-CM

## 2020-01-17 MED ORDER — OLANZAPINE 20 MG PO TBDP: 20.0000 mg | ORAL_TABLET | Freq: Four times a day (QID) | ORAL | 3 refills | Status: DC

## 2020-01-17 MED ORDER — CLONAZEPAM 2 MG PO TBDP: 2.0000 mg | ORAL_TABLET | Freq: Every day | ORAL | 3 refills | Status: DC

## 2020-01-17 NOTE — Progress Notes (Signed)
Virtual Visit via Telephone Note  I connected with Logan [name] on 01/17/20 at  9:20 AM EST by telephone and verified that I am speaking with the correct person using two identifiers.  Location: Patient: home Provider: home   I discussed the limitations, risks, security and privacy concerns of performing an evaluation and management service by telephone and the availability of in person appointments. I also discussed with the patient that there may be a patient responsible charge related to this service. The patient expressed understanding and agreed to proceed.    I discussed the assessment and treatment plan with the patient. The patient was provided an opportunity to ask questions and all were answered. The patient agreed with the plan and demonstrated an understanding of the instructions.   The patient was advised to call back or seek an in-person evaluation if the symptoms worsen or if the condition fails to improve as anticipated.  I provided 15 minutes of non-face-to-face time during this encounter.   [name], MD  [facility] MD/PA/NP OP Progress Note  01/17/2020 9:35 AM Logan [name]  MRN:  [id number]  Chief Complaint:  Chief Complaint    ADHD; Anxiety     HPI: This patient is a34 year old single black male who lives with his mother in [place]. He has completed the development disability program at [employer] and has nothing to do to occupy his time. The patienthad been seenin our [facility] clinic but the mother wanted to bring him here to establish care.  The patient has been autistic since a young age. Apparently he developed normally originally but began regressing around age 34 and lost his speech. He was always a very strong aggressive child. He's been in special education classes all his life. Unfortunately he's never been totally potty trained and still wears a diaper because he defecates in his pants. This is made it difficult to get them in any  special needs program's or to get him workers. He also has problems with focus but has had a pretty good response to Adderall. He walks around around in a posturing position with both arms flexed and pulling his fingers. He drools a lot. His younger brother recently became agitated and violent and the mother called social services to possibly get them both placed on the home. She is now reconsidering this and I suggested she look at other options such as getting more help in the home. Overall she thinks his medications are effective.  The patient's mother returns for follow-up after 3 months.  The patient is autistic and has no stage and cannot participate in telemedicine effectively.  She states she has been stable.  She has added fiber to his diet through some gummy bears and he is now having regular bowel movements and this seems to have improved his disposition.  She states at times he is "stubborn" and will not follow direction but most the time he does well.  He has not been violent or agitated.  He is eating and sleeping well and has gained up to 146 pounds. Visit Diagnosis:    ICD-10-CM   1. Autism  F84.0   2. Episodic mood disorder (HCC)  F39     Past Psychiatric History: Long-term outpatient treatment  Past Medical History:  Past Medical History:  Diagnosis Date  . ADHD (attention deficit hyperactivity disorder)   . Autism     Past Surgical History:  Procedure Laterality Date  . MOUTH SURGERY     FILLING/TOOTH  REPAIR    Family Psychiatric History: see below  Family History:  Family History  Problem Relation Age of Onset  . Autism Brother   . Colon cancer Neg Hx   . Colon polyps Neg Hx     Social History:  Social History   Socioeconomic History  . Marital status: Single    Spouse name: Not on file  . Number of children: Not on file  . Years of education: Not on file  . Highest education level: Not on file  Occupational History  . Not on file  Tobacco Use  .  Smoking status: Never Smoker  . Smokeless tobacco: Never Used  Substance and Sexual Activity  . Alcohol use: No  . Drug use: No  . Sexual activity: Never  Other Topics Concern  . Not on file  Social History Narrative   DIAGNOSED WITH AUTISM AT AGE 34. HAS ANOTHER AUTISTIC BROTHER. MOTHER CARES FOR HIM.   Social Determinants of Health   Financial Resource Strain:   . Difficulty of Paying Living Expenses: Not on file  Food Insecurity:   . Worried About Programme researcher, broadcasting/film/video in the Last Year: Not on file  . Ran Out of Food in the Last Year: Not on file  Transportation Needs:   . Lack of Transportation (Medical): Not on file  . Lack of Transportation (Non-Medical): Not on file  Physical Activity:   . Days of Exercise per Week: Not on file  . Minutes of Exercise per Session: Not on file  Stress:   . Feeling of Stress : Not on file  Social Connections:   . Frequency of Communication with Friends and Family: Not on file  . Frequency of Social Gatherings with Friends and Family: Not on file  . Attends Religious Services: Not on file  . Active Member of Clubs or Organizations: Not on file  . Attends Banker Meetings: Not on file  . Marital Status: Not on file    Allergies: No Known Allergies  Metabolic Disorder Labs: No results found for: HGBA1C, MPG No results found for: PROLACTIN No results found for: CHOL, TRIG, HDL, CHOLHDL, VLDL, LDLCALC No results found for: TSH  Therapeutic Level Labs: No results found for: LITHIUM No results found for: VALPROATE No components found for:  CBMZ  Current Medications: Current Outpatient Medications  Medication Sig Dispense Refill  . clonazepam (KLONOPIN) 2 MG disintegrating tablet Take 1 tablet (2 mg total) by mouth at bedtime. 30 tablet 3  . cloNIDine (CATAPRES - DOSED IN MG/24 HR) 0.2 mg/24hr patch Place 0.2 mg onto the skin once a week.    . linaclotide (LINZESS) 145 MCG CAPS capsule Take 145 mcg by mouth daily before  breakfast. *Opens capsule and administers    . LORazepam (ATIVAN) 1 MG tablet Take one the night before procedure and one the am of dental procedure 2 tablet 0  . mometasone (ELOCON) 0.1 % cream Apply 1 application topically as needed.    Marland Kitchen OLANZapine zydis (ZYPREXA) 20 MG disintegrating tablet Take 1 tablet (20 mg total) by mouth 4 (four) times daily. 120 tablet 3  . polyethylene glycol (MIRALAX / GLYCOLAX) packet Take 17 g by mouth daily. 14 each 0   No current facility-administered medications for this visit.     Musculoskeletal: Strength & Muscle Tone: within normal limits Gait & Station: normal Patient leans: N/A  Psychiatric Specialty Exam: Review of Systems  All other systems reviewed and are negative.   There were  no vitals taken for this visit.There is no height or weight on file to calculate BMI.  General Appearance: NA  Eye Contact:  NA  Speech:  NA  Volume:  no speech  Mood:  NA  Affect:  NA  Thought Process:  NA  Orientation:  NA  Thought Content: NA   Suicidal Thoughts:  No  Homicidal Thoughts:  No  Memory:  NA  Judgement:  Impaired  Insight:  Lacking  Psychomotor Activity:  Mannerisms and Restlessness  Concentration:  Concentration: Poor and Attention Span: Poor  Recall:  Poor  Fund of Knowledge: Poor  Language: none  Akathisia:  No  Handed:  Right  AIMS (if indicated): not done  Assets:  Physical Health Resilience Social Support  ADL's:  Intact  Cognition: Impaired,  Moderate  Sleep:  Good   Screenings:   Assessment and Plan: Patient is a 34 year old male with a history of moderate cognitive impairment autism and agitation.  He is doing well with the Zyprexa Zydis 20 mg 4 times daily for agitation and mood stabilization as well as clonazepam to milligram disintegrating tablet at bedtime for anxiety and sleep.  He will return with his mother in 48 months   [name], MD 01/17/2020, 9:35 AM

## 2020-02-07 DIAGNOSIS — R32 Unspecified urinary incontinence: Secondary | ICD-10-CM | POA: Diagnosis not present

## 2020-02-24 DIAGNOSIS — I10 Essential (primary) hypertension: Secondary | ICD-10-CM | POA: Diagnosis not present

## 2020-02-24 DIAGNOSIS — K5901 Slow transit constipation: Secondary | ICD-10-CM | POA: Diagnosis not present

## 2020-02-24 DIAGNOSIS — Z7189 Other specified counseling: Secondary | ICD-10-CM | POA: Diagnosis not present

## 2020-02-24 DIAGNOSIS — F840 Autistic disorder: Secondary | ICD-10-CM | POA: Diagnosis not present

## 2020-05-14 DIAGNOSIS — F71 Moderate intellectual disabilities: Secondary | ICD-10-CM | POA: Diagnosis not present

## 2020-05-14 DIAGNOSIS — F840 Autistic disorder: Secondary | ICD-10-CM | POA: Diagnosis not present

## 2020-05-14 DIAGNOSIS — R32 Unspecified urinary incontinence: Secondary | ICD-10-CM | POA: Diagnosis not present

## 2020-05-14 DIAGNOSIS — R159 Full incontinence of feces: Secondary | ICD-10-CM | POA: Diagnosis not present

## 2020-05-15 ENCOUNTER — Telehealth (INDEPENDENT_AMBULATORY_CARE_PROVIDER_SITE_OTHER): Payer: Medicare Other | Admitting: Psychiatry

## 2020-05-15 ENCOUNTER — Other Ambulatory Visit: Payer: Self-pay

## 2020-05-15 ENCOUNTER — Encounter (HOSPITAL_COMMUNITY): Payer: Self-pay | Admitting: Psychiatry

## 2020-05-15 DIAGNOSIS — F39 Unspecified mood [affective] disorder: Secondary | ICD-10-CM | POA: Diagnosis not present

## 2020-05-15 DIAGNOSIS — F840 Autistic disorder: Secondary | ICD-10-CM

## 2020-05-15 MED ORDER — OLANZAPINE 20 MG PO TBDP: 20.0000 mg | ORAL_TABLET | Freq: Four times a day (QID) | ORAL | 3 refills | Status: DC

## 2020-05-15 MED ORDER — CLONAZEPAM 2 MG PO TBDP: 2.0000 mg | ORAL_TABLET | Freq: Every day | ORAL | 3 refills | Status: DC

## 2020-05-15 NOTE — Progress Notes (Signed)
Virtual Visit via Telephone Note  I connected with Logan [name] on 05/15/20 at 11:20 AM EDT by telephone and verified that I am speaking with the correct person using two identifiers.  Location: Patient: home Provider: home   I discussed the limitations, risks, security and privacy concerns of performing an evaluation and management service by telephone and the availability of in person appointments. I also discussed with the patient that there may be a patient responsible charge related to this service. The patient expressed understanding and agreed to proceed.    I discussed the assessment and treatment plan with the patient. The patient was provided an opportunity to ask questions and all were answered. The patient agreed with the plan and demonstrated an understanding of the instructions.   The patient was advised to call back or seek an in-person evaluation if the symptoms worsen or if the condition fails to improve as anticipated.  I provided 15 minutes of non-face-to-face time during this encounter.   Logan Ruder, MD  [facility] MD/PA/NP OP Progress Note  05/15/2020 11:34 AM [name]  MRN:  [id number]  Chief Complaint:  Chief Complaint    Agitation; Follow-up     HPI: This patient is a35 year old single black male who lives with his mother in [place]. He has completed the development disability program at [employer] and has nothing to do to occupy his time. The patienthad been seenin our [facility] clinic but the mother wanted to bring him here to establish care.  The patient has been autistic since a young age. Apparently he developed normally originally but began regressing around age 35 and lost his speech. He was always a very strong aggressive child. He's been in special education classes all his life. Unfortunately he's never been totally potty trained and still wears a diaper because he defecates in his pants. This is made it difficult to get them  in any special needs program's or to get him workers. He also has problems with focus but has had a pretty good response to Adderall. He walks around around in a posturing position with both arms flexed and pulling his fingers. He drools a lot. His younger brother recently became agitated and violent and the mother called social services to possibly get them both placed on the home. She is now reconsidering this and I suggested she look at other options such as getting more help in the home. Overall she thinks his medications are effective.  The patient's mother returns for follow-up after 3 months.  The patient is nonverbal and severely autistic and cannot participate in telemedicine.  She states that overall he is doing fairly well.  He is sleeping well at night.  He is eating well.  His weight is now 141 pounds.  He still has issues with constipation at times with the MiraLAX helps.  When he is constipated he does have some more agitation.  The patient has not been violent.  He does not like to be bothered too much and as long as the mother keeps to her routine with him he does fine.  He has been "mouthing" inanimate things and the mother is trying to distract him from this.  Visit Diagnosis:    ICD-10-CM   1. Autism  F84.0   2. Episodic mood disorder (HCC)  F39     Past Psychiatric History: Long-term outpatient treatment  Past Medical History:  Past Medical History:  Diagnosis Date  . ADHD (attention deficit hyperactivity disorder)   .  Autism     Past Surgical History:  Procedure Laterality Date  . MOUTH SURGERY     FILLING/TOOTH REPAIR    Family Psychiatric History: see below  Family History:  Family History  Problem Relation Age of Onset  . Autism Brother   . Colon cancer Neg Hx   . Colon polyps Neg Hx     Social History:  Social History   Socioeconomic History  . Marital status: Single    Spouse name: Not on file  . Number of children: Not on file  . Years of  education: Not on file  . Highest education level: Not on file  Occupational History  . Not on file  Tobacco Use  . Smoking status: Never Smoker  . Smokeless tobacco: Never Used  Substance and Sexual Activity  . Alcohol use: No  . Drug use: No  . Sexual activity: Never  Other Topics Concern  . Not on file  Social History Narrative   DIAGNOSED WITH AUTISM AT AGE 35. HAS ANOTHER AUTISTIC BROTHER. MOTHER CARES FOR HIM.   Social Determinants of Health   Financial Resource Strain: Not on file  Food Insecurity: Not on file  Transportation Needs: Not on file  Physical Activity: Not on file  Stress: Not on file  Social Connections: Not on file    Allergies: No Known Allergies  Metabolic Disorder Labs: No results found for: HGBA1C, MPG No results found for: PROLACTIN No results found for: CHOL, TRIG, HDL, CHOLHDL, VLDL, LDLCALC No results found for: TSH  Therapeutic Level Labs: No results found for: LITHIUM No results found for: VALPROATE No components found for:  CBMZ  Current Medications: Current Outpatient Medications  Medication Sig Dispense Refill  . clonazepam (KLONOPIN) 2 MG disintegrating tablet Take 1 tablet (2 mg total) by mouth at bedtime. 30 tablet 3  . cloNIDine (CATAPRES - DOSED IN MG/24 HR) 0.2 mg/24hr patch Place 0.2 mg onto the skin once a week.    . linaclotide (LINZESS) 145 MCG CAPS capsule Take 145 mcg by mouth daily before breakfast. *Opens capsule and administers    . LORazepam (ATIVAN) 1 MG tablet Take one the night before procedure and one the am of dental procedure 2 tablet 0  . mometasone (ELOCON) 0.1 % cream Apply 1 application topically as needed.    Marland Kitchen OLANZapine zydis (ZYPREXA) 20 MG disintegrating tablet Take 1 tablet (20 mg total) by mouth 4 (four) times daily. 120 tablet 3  . polyethylene glycol (MIRALAX / GLYCOLAX) packet Take 17 g by mouth daily. 14 each 0   No current facility-administered medications for this visit.      Musculoskeletal: Strength & Muscle Tone: within normal limits Gait & Station: normal Patient leans: N/A  Psychiatric Specialty Exam: Review of Systems  Psychiatric/Behavioral: Positive for agitation and behavioral problems.  All other systems reviewed and are negative.   There were no vitals taken for this visit.There is no height or weight on file to calculate BMI.  General Appearance: NA  Eye Contact:  NA  Speech:  no speech  Volume:  none  Mood:  NA  Affect:  NA  Thought Process:  NA  Orientation:  Negative  Thought Content: NA   Suicidal Thoughts:  No  Homicidal Thoughts:  No  Memory:  NA  Judgement:  Impaired  Insight:  Present  Psychomotor Activity:  Restlessness  Concentration:  Concentration: Poor and Attention Span: Poor  Recall:  Poor  Fund of Knowledge: Poor  Language: none  Akathisia:  No  Handed:  Right  AIMS (if indicated): not done  Assets:  Physical Health Resilience Social Support  ADL's:  Impaired  Cognition: Impaired,  Moderate  Sleep:  Good   Screenings:   Assessment and Plan: This patient is a 35 year old male with a history of moderate cognitive impairment autism and agitation.  He continues to do well according to his mother's report.  He will continue Zyprexa Zydis 20 mg 4 times daily for agitation and mood stabilization as well as clonazepam 2 mg at bedtime for anxiety and sleep.  He will return to see me with his mother in 52 months   Logan Ruder, MD 05/15/2020, 11:34 AM

## 2020-05-25 DIAGNOSIS — F840 Autistic disorder: Secondary | ICD-10-CM | POA: Diagnosis not present

## 2020-05-25 DIAGNOSIS — K5901 Slow transit constipation: Secondary | ICD-10-CM | POA: Diagnosis not present

## 2020-05-25 DIAGNOSIS — Z20828 Contact with and (suspected) exposure to other viral communicable diseases: Secondary | ICD-10-CM | POA: Diagnosis not present

## 2020-05-25 DIAGNOSIS — I10 Essential (primary) hypertension: Secondary | ICD-10-CM | POA: Diagnosis not present

## 2020-06-22 ENCOUNTER — Telehealth (HOSPITAL_COMMUNITY): Payer: Self-pay | Admitting: *Deleted

## 2020-06-22 ENCOUNTER — Other Ambulatory Visit (HOSPITAL_COMMUNITY): Payer: Self-pay | Admitting: Psychiatry

## 2020-06-22 MED ORDER — CLONAZEPAM 1 MG PO TBDP: 1.0000 mg | ORAL_TABLET | Freq: Every day | ORAL | 2 refills | Status: DC

## 2020-06-22 NOTE — Telephone Encounter (Signed)
sent 

## 2020-06-22 NOTE — Telephone Encounter (Signed)
Informed patient mother with what provider stated and she verbalized understanding.  

## 2020-06-22 NOTE — Telephone Encounter (Signed)
Patient mother called stating that she would like for provider to please call in 1 mg instead of 2 mg of pt clonazepam (KLONOPIN) 2 MG disintegrating tablet. Per pt mother, patient is not waking and he is wetting himself during the night.

## 2020-07-28 ENCOUNTER — Telehealth (HOSPITAL_COMMUNITY): Payer: Self-pay | Admitting: *Deleted

## 2020-07-28 NOTE — Telephone Encounter (Signed)
Patient is needing refills for all of his medications provider prescribes for him. Patient appt was cancelled due to provider being on medical leave. Message was left to call office to resch appt.  

## 2020-07-30 ENCOUNTER — Other Ambulatory Visit (HOSPITAL_COMMUNITY): Payer: Self-pay | Admitting: Psychiatry

## 2020-07-30 MED ORDER — OLANZAPINE 20 MG PO TBDP: 20.0000 mg | ORAL_TABLET | Freq: Four times a day (QID) | ORAL | 3 refills | Status: DC

## 2020-07-30 MED ORDER — CLONAZEPAM 1 MG PO TBDP: 1.0000 mg | ORAL_TABLET | Freq: Every day | ORAL | 2 refills | Status: DC

## 2020-07-30 NOTE — Telephone Encounter (Signed)
sent 

## 2020-08-14 ENCOUNTER — Telehealth (HOSPITAL_COMMUNITY): Payer: Medicare Other | Admitting: Psychiatry

## 2020-09-10 ENCOUNTER — Telehealth (HOSPITAL_COMMUNITY): Payer: Medicare Other | Admitting: Psychiatry

## 2020-10-02 ENCOUNTER — Encounter (HOSPITAL_COMMUNITY): Payer: Self-pay | Admitting: Psychiatry

## 2020-10-02 ENCOUNTER — Other Ambulatory Visit: Payer: Self-pay

## 2020-10-02 ENCOUNTER — Telehealth (INDEPENDENT_AMBULATORY_CARE_PROVIDER_SITE_OTHER): Payer: Medicare Other | Admitting: Psychiatry

## 2020-10-02 DIAGNOSIS — F840 Autistic disorder: Secondary | ICD-10-CM | POA: Diagnosis not present

## 2020-10-02 DIAGNOSIS — F39 Unspecified mood [affective] disorder: Secondary | ICD-10-CM | POA: Diagnosis not present

## 2020-10-02 MED ORDER — LORAZEPAM 1 MG PO TABS: ORAL_TABLET | ORAL | 0 refills | Status: DC

## 2020-10-02 MED ORDER — OLANZAPINE 20 MG PO TBDP: 20.0000 mg | ORAL_TABLET | Freq: Four times a day (QID) | ORAL | 3 refills | Status: DC

## 2020-10-02 MED ORDER — CLONAZEPAM 1 MG PO TBDP: 1.0000 mg | ORAL_TABLET | Freq: Every day | ORAL | 2 refills | Status: DC

## 2020-10-02 NOTE — Progress Notes (Signed)
Virtual Visit via Telephone Note  I connected with Logan [name] on 10/02/20 at 11:20 AM EDT by telephone and verified that I am speaking with the correct person using two identifiers.  Location: Patient: home Provider: home office   I discussed the limitations, risks, security and privacy concerns of performing an evaluation and management service by telephone and the availability of in person appointments. I also discussed with the patient that there may be a patient responsible charge related to this service. The patient expressed understanding and agreed to proceed.      I discussed the assessment and treatment plan with the patient. The patient was provided an opportunity to ask questions and all were answered. The patient agreed with the plan and demonstrated an understanding of the instructions.   The patient was advised to call back or seek an in-person evaluation if the symptoms worsen or if the condition fails to improve as anticipated.  I provided 15 minutes of non-face-to-face time during this encounter.   [name], MD  [facility] MD/PA/NP OP Progress Note  10/02/2020 11:45 AM Logan [name]  MRN:  [id number]  Chief Complaint:  Chief Complaint   Agitation; Anxiety; Follow-up    HPI: This patient is a 35 year old single black male who lives with his mother  in [place]. He has completed the development disability program at [employer] and has nothing to do to occupy his time. The patient had been seen  in our [place] clinic but the mother wanted to bring him here to establish care.   The patient has been autistic since a young age. Apparently he developed normally originally but began regressing around age 85 and lost his speech. He was always a very strong aggressive child. He's been in special education classes all his life. Unfortunately he's never been totally potty trained and still wears a diaper because he defecates in his pants. This is made it  difficult to get them in any special needs program's or to get him workers. He also has problems with focus but has had a pretty good response to Adderall. He walks around around in a posturing position with both arms flexed and pulling his fingers. He drools a lot. His younger brother recently became agitated and violent and the mother called social services to possibly get them both placed on the home. She is now reconsidering this and I suggested she look at other options such as getting more help in the home. Overall she thinks his medications are effective.  The mother returns for follow-up after 3 months.  The patient is not able to participate in virtual sessions.  The mother states that he has been about the same.  He is sleeping and eating fairly well.  He does not like to leave the house much.  However he has not been agitated or violent.  She thinks his medications are still fairly effective.  She has had a very difficult time getting him into a dentist that will take his insurance or will deal with that person with autism and agitated behavior.  She thinks he has some teeth in the back that need to be pulled.  So far he does not seem to be in much distress. Visit Diagnosis:    ICD-10-CM   1. Autism  F84.0     2. Episodic mood disorder (HCC)  F39       Past Psychiatric History: Long-term outpatient treatment  Past Medical History:  Past Medical History:  Diagnosis  Date   ADHD (attention deficit hyperactivity disorder)    Autism     Past Surgical History:  Procedure Laterality Date   MOUTH SURGERY     FILLING/TOOTH REPAIR    Family Psychiatric History: see below  Family History:  Family History  Problem Relation Age of Onset   Autism Brother    Colon cancer Neg Hx    Colon polyps Neg Hx     Social History:  Social History   Socioeconomic History   Marital status: Single    Spouse name: Not on file   Number of children: Not on file   Years of education: Not on file    Highest education level: Not on file  Occupational History   Not on file  Tobacco Use   Smoking status: Never   Smokeless tobacco: Never  Substance and Sexual Activity   Alcohol use: No   Drug use: No   Sexual activity: Never  Other Topics Concern   Not on file  Social History Narrative   DIAGNOSED WITH AUTISM AT AGE 53. HAS ANOTHER AUTISTIC BROTHER. MOTHER CARES FOR HIM.   Social Determinants of Health   Financial Resource Strain: Not on file  Food Insecurity: Not on file  Transportation Needs: Not on file  Physical Activity: Not on file  Stress: Not on file  Social Connections: Not on file    Allergies: No Known Allergies  Metabolic Disorder Labs: No results found for: HGBA1C, MPG No results found for: PROLACTIN No results found for: CHOL, TRIG, HDL, CHOLHDL, VLDL, LDLCALC No results found for: TSH  Therapeutic Level Labs: No results found for: LITHIUM No results found for: VALPROATE No components found for:  CBMZ  Current Medications: Current Outpatient Medications  Medication Sig Dispense Refill   clonazePAM (KLONOPIN) 1 MG disintegrating tablet Take 1 tablet (1 mg total) by mouth at bedtime. 30 tablet 2   cloNIDine (CATAPRES - DOSED IN MG/24 HR) 0.2 mg/24hr patch Place 0.2 mg onto the skin once a week.     linaclotide (LINZESS) 145 MCG CAPS capsule Take 145 mcg by mouth daily before breakfast. *Opens capsule and administers     LORazepam (ATIVAN) 1 MG tablet Take one the night before procedure and one the am of dental procedure 2 tablet 0   mometasone (ELOCON) 0.1 % cream Apply 1 application topically as needed.     OLANZapine zydis (ZYPREXA) 20 MG disintegrating tablet Take 1 tablet (20 mg total) by mouth 4 (four) times daily. 120 tablet 3   polyethylene glycol (MIRALAX / GLYCOLAX) packet Take 17 g by mouth daily. 14 each 0   No current facility-administered medications for this visit.     Musculoskeletal: Strength & Muscle Tone: within normal  limits Gait & Station: normal Patient leans: N/A  Psychiatric Specialty Exam: Review of Systems  Psychiatric/Behavioral:  Positive for agitation. The patient is nervous/anxious and is hyperactive.   All other systems reviewed and are negative.  There were no vitals taken for this visit.There is no height or weight on file to calculate BMI.  General Appearance: NA  Eye Contact:  NA  Speech:   none  Volume:   no speech  Mood:  NA  Affect:  NA  Thought Process:  NA  Orientation:  Other:  none  Thought Content: NA   Suicidal Thoughts:  No  Homicidal Thoughts:  No  Memory:  NA  Judgement:  Impaired  Insight:  Lacking  Psychomotor Activity:  Mannerisms  Concentration:  Concentration:  Poor and Attention Span: Poor  Recall:  Poor  Fund of Knowledge: Poor  Language: Poor  Akathisia:  No  Handed:  Right  AIMS (if indicated): not done  Assets:  Physical Health Resilience Social Support  ADL's:  Impaired  Cognition: Impaired,  Severe  Sleep:  Good   Screenings:   Assessment and Plan: Patient is a 35 year old male with a history of moderate to severe cognitive impairment autosensing and agitation.  According to his mother he has been stable.  He will continue Zyprexa Zydis 20 mg 4 times daily for agitation and mood stabilization as well as clonazepam 2 mg at bedtime for anxiety and sleep.  I have also sent in Ativan that he can use in case she does get him into a dentist to help calm him down.  He will return to see me in 3 months   [name], MD 10/02/2020, 11:45 AM

## 2020-10-12 DIAGNOSIS — I10 Essential (primary) hypertension: Secondary | ICD-10-CM | POA: Diagnosis not present

## 2020-10-12 DIAGNOSIS — K5901 Slow transit constipation: Secondary | ICD-10-CM | POA: Diagnosis not present

## 2020-10-12 DIAGNOSIS — F840 Autistic disorder: Secondary | ICD-10-CM | POA: Diagnosis not present

## 2021-01-01 ENCOUNTER — Telehealth (INDEPENDENT_AMBULATORY_CARE_PROVIDER_SITE_OTHER): Payer: Medicare Other | Admitting: Psychiatry

## 2021-01-01 ENCOUNTER — Encounter (HOSPITAL_COMMUNITY): Payer: Self-pay | Admitting: Psychiatry

## 2021-01-01 ENCOUNTER — Other Ambulatory Visit: Payer: Self-pay

## 2021-01-01 DIAGNOSIS — F39 Unspecified mood [affective] disorder: Secondary | ICD-10-CM

## 2021-01-01 DIAGNOSIS — F840 Autistic disorder: Secondary | ICD-10-CM | POA: Diagnosis not present

## 2021-01-01 MED ORDER — OLANZAPINE 20 MG PO TBDP: 20.0000 mg | ORAL_TABLET | Freq: Four times a day (QID) | ORAL | 3 refills | Status: DC

## 2021-01-01 MED ORDER — CLONAZEPAM 1 MG PO TBDP: 1.0000 mg | ORAL_TABLET | Freq: Every day | ORAL | 2 refills | Status: DC

## 2021-01-01 NOTE — Progress Notes (Signed)
[facility] MD/PA/NP OP Progress Note  01/01/2021 10:15 AM Logan [name]  MRN:  [id number]  Chief Complaint:  Chief Complaint   Agitation; Follow-up    HPI: This patient is a 35 year old single black male who lives with [place] mother  in [place]. He has completed the development disability program at [employer] and has nothing to do to occupy his time. The patient had been seen  in our [place] clinic but the mother wanted to bring him here to establish care.  The patient's mother returns for follow-up after 3 months.  The patient is not able to participate in virtual sessions.  She states he is doing about the same.  He still has 2 teeth that need to be pulled.  Right now its not causing much distress but the mother is worried that it will in the future.  She has tried numerous dentists and oral surgeons and no one wants to deal with them or take Medicaid insurance.  She is worried that it will get to the point where he gets infected and gets very agitated.  Her primary doctor's office is trying to find a dentist for them.  In the meantime she states he has not been agitated or violent he is sleeping and eating well his weight is up to 140 pounds.  She still thinks his medications have been effective and she denies any new or significant side effects Visit Diagnosis:    ICD-10-CM   1. Autism  F84.0     2. Episodic mood disorder (HCC)  F39       Past Psychiatric History: Long-term outpatient treatment  Past Medical History:  Past Medical History:  Diagnosis Date   ADHD (attention deficit hyperactivity disorder)    Autism     Past Surgical History:  Procedure Laterality Date   MOUTH SURGERY     FILLING/TOOTH REPAIR    Family Psychiatric History: see below  Family History:  Family History  Problem Relation Age of Onset   Autism Brother    Colon cancer Neg Hx    Colon polyps Neg Hx     Social History:  Social History   Socioeconomic History   Marital status:  Single    Spouse name: Not on file   Number of children: Not on file   Years of education: Not on file   Highest education level: Not on file  Occupational History   Not on file  Tobacco Use   Smoking status: Never   Smokeless tobacco: Never  Substance and Sexual Activity   Alcohol use: No   Drug use: No   Sexual activity: Never  Other Topics Concern   Not on file  Social History Narrative   DIAGNOSED WITH AUTISM AT AGE 66. HAS ANOTHER AUTISTIC BROTHER. MOTHER CARES FOR HIM.   Social Determinants of Health   Financial Resource Strain: Not on file  Food Insecurity: Not on file  Transportation Needs: Not on file  Physical Activity: Not on file  Stress: Not on file  Social Connections: Not on file    Allergies: No Known Allergies  Metabolic Disorder Labs: No results found for: HGBA1C, MPG No results found for: PROLACTIN No results found for: CHOL, TRIG, HDL, CHOLHDL, VLDL, LDLCALC No results found for: TSH  Therapeutic Level Labs: No results found for: LITHIUM No results found for: VALPROATE No components found for:  CBMZ  Current Medications: Current Outpatient Medications  Medication Sig Dispense Refill   clonazePAM (KLONOPIN) 1 MG disintegrating tablet  Take 1 tablet (1 mg total) by mouth at bedtime. 30 tablet 2   cloNIDine (CATAPRES - DOSED IN MG/24 HR) 0.2 mg/24hr patch Place 0.2 mg onto the skin once a week.     linaclotide (LINZESS) 145 MCG CAPS capsule Take 145 mcg by mouth daily before breakfast. *Opens capsule and administers     LORazepam (ATIVAN) 1 MG tablet Take one the night before procedure and one the am of dental procedure 2 tablet 0   mometasone (ELOCON) 0.1 % cream Apply 1 application topically as needed.     OLANZapine zydis (ZYPREXA) 20 MG disintegrating tablet Take 1 tablet (20 mg total) by mouth 4 (four) times daily. 120 tablet 3   polyethylene glycol (MIRALAX / GLYCOLAX) packet Take 17 g by mouth daily. 14 each 0   No current  facility-administered medications for this visit.     Musculoskeletal: Strength & Muscle Tone: na Gait & Station: na Patient leans: N/A  Psychiatric Specialty Exam: Review of Systems  All other systems reviewed and are negative.  There were no vitals taken for this visit.There is no height or weight on file to calculate BMI.  General Appearance: NA  Eye Contact:  NA  Speech:  NA  Volume:  na  Mood:  NA  Affect:  NA  Thought Process:  NA  Orientation:  NA  Thought Content: NA   Suicidal Thoughts:  No  Homicidal Thoughts:  No  Memory:  NA  Judgement:  Impaired  Insight:  Lacking  Psychomotor Activity:  Mannerisms  Concentration:  Concentration: Poor and Attention Span: Poor  Recall:  Poor  Fund of Knowledge: Poor  Language: Poor  Akathisia:  No  Handed:  Right  AIMS (if indicated): not done  Assets:  Physical Health Resilience Social Support  ADL's:  Impaired  Cognition: Impaired,  Severe  Sleep:  Good   Screenings:   Assessment and Plan: This patient is a 35 year old male with a history of moderate to severe cognitive impairment autism and a history of agitation.  According to his mother he has been stable.  We will continue Zyprexa Zydis 20 mg 4 times daily for agitation and mood stabilization as well as clonazepam 2 mg at bedtime for anxiety and sleep.  I told the mother I would try to talk to anyone at [facility] who could provide dental resources.  She will return to see me in 3 months   [name], MD 01/01/2021, 10:15 AM

## 2021-01-26 DIAGNOSIS — K029 Dental caries, unspecified: Secondary | ICD-10-CM | POA: Diagnosis not present

## 2021-01-26 DIAGNOSIS — K5901 Slow transit constipation: Secondary | ICD-10-CM | POA: Diagnosis not present

## 2021-01-26 DIAGNOSIS — I10 Essential (primary) hypertension: Secondary | ICD-10-CM | POA: Diagnosis not present

## 2021-01-26 DIAGNOSIS — F840 Autistic disorder: Secondary | ICD-10-CM | POA: Diagnosis not present

## 2021-03-25 ENCOUNTER — Other Ambulatory Visit: Payer: Self-pay

## 2021-03-25 ENCOUNTER — Encounter (HOSPITAL_COMMUNITY): Payer: Self-pay | Admitting: Psychiatry

## 2021-03-25 ENCOUNTER — Telehealth (INDEPENDENT_AMBULATORY_CARE_PROVIDER_SITE_OTHER): Payer: Medicare Other | Admitting: Psychiatry

## 2021-03-25 DIAGNOSIS — F840 Autistic disorder: Secondary | ICD-10-CM

## 2021-03-25 DIAGNOSIS — F39 Unspecified mood [affective] disorder: Secondary | ICD-10-CM

## 2021-03-25 MED ORDER — OLANZAPINE 20 MG PO TBDP: 20.0000 mg | ORAL_TABLET | Freq: Four times a day (QID) | ORAL | 3 refills | Status: DC

## 2021-03-25 MED ORDER — CLONAZEPAM 2 MG PO TBDP: 2.0000 mg | ORAL_TABLET | Freq: Every day | ORAL | 2 refills | Status: DC

## 2021-03-25 NOTE — Progress Notes (Signed)
Virtual Visit via Telephone Note  I connected with Logan [name] on 03/25/21 at 11:00 AM EST by telephone and verified that I am speaking with the correct person using two identifiers.  Location: Patient: home Provider: office   I discussed the limitations, risks, security and privacy concerns of performing an evaluation and management service by telephone and the availability of in person appointments. I also discussed with the patient that there may be a patient responsible charge related to this service. The patient expressed understanding and agreed to proceed.     I discussed the assessment and treatment plan with the patient. The patient was provided an opportunity to ask questions and all were answered. The patient agreed with the plan and demonstrated an understanding of the instructions.   The patient was advised to call back or seek an in-person evaluation if the symptoms worsen or if the condition fails to improve as anticipated.  I provided 12 minutes of non-face-to-face time during this encounter.   [name], MD  [facility] MD/PA/NP OP Progress Note  03/25/2021 11:11 AM Logan [name]  MRN:  [id number]  Chief Complaint:  Chief Complaint   Agitation; Follow-up    HPI: This patient is a 36 year old single black male who lives with his mother  in [place]. He has completed the development disability program at [employer] and has nothing to do to occupy his time. The patient had been seen  in our [place] clinic but the mother wanted to bring him here to establish care.  The patient's mother returns for follow-up after 3 months.  The patient is too impaired to participate in virtual sessions.  The mother was finally able to get him into see a dentist through the [facility].  He was found to have 4 teeth that need to be pulled.  He is going to have to do this at [facility] later this month under anesthesia.  Because his teeth are hurting he is not  eating as well and he is also more agitated.  The mother states he is not sleeping and would like an increase in the clonazepam at night which I think is reasonable.  He has not been violent to self or others. Visit Diagnosis:    ICD-10-CM   1. Autism  F84.0     2. Episodic mood disorder (HCC)  F39       Past Psychiatric History: Long-term outpatient treatment  Past Medical History:  Past Medical History:  Diagnosis Date   ADHD (attention deficit hyperactivity disorder)    Autism     Past Surgical History:  Procedure Laterality Date   MOUTH SURGERY     FILLING/TOOTH REPAIR    Family Psychiatric History: see below  Family History:  Family History  Problem Relation Age of Onset   Autism Brother    Colon cancer Neg Hx    Colon polyps Neg Hx     Social History:  Social History   Socioeconomic History   Marital status: Single    Spouse name: Not on file   Number of children: Not on file   Years of education: Not on file   Highest education level: Not on file  Occupational History   Not on file  Tobacco Use   Smoking status: Never   Smokeless tobacco: Never  Substance and Sexual Activity   Alcohol use: No   Drug use: No   Sexual activity: Never  Other Topics Concern   Not on  file  Social History Narrative   DIAGNOSED WITH AUTISM AT AGE 73. HAS ANOTHER AUTISTIC BROTHER. MOTHER CARES FOR HIM.   Social Determinants of Health   Financial Resource Strain: Not on file  Food Insecurity: Not on file  Transportation Needs: Not on file  Physical Activity: Not on file  Stress: Not on file  Social Connections: Not on file    Allergies: No Known Allergies  Metabolic Disorder Labs: No results found for: HGBA1C, MPG No results found for: PROLACTIN No results found for: CHOL, TRIG, HDL, CHOLHDL, VLDL, LDLCALC No results found for: TSH  Therapeutic Level Labs: No results found for: LITHIUM No results found for: VALPROATE No components found for:  CBMZ  Current  Medications: Current Outpatient Medications  Medication Sig Dispense Refill   clonazepam (KLONOPIN) 2 MG disintegrating tablet Take 1 tablet (2 mg total) by mouth at bedtime. 30 tablet 2   cloNIDine (CATAPRES - DOSED IN MG/24 HR) 0.2 mg/24hr patch Place 0.2 mg onto the skin once a week.     linaclotide (LINZESS) 145 MCG CAPS capsule Take 145 mcg by mouth daily before breakfast. *Opens capsule and administers     LORazepam (ATIVAN) 1 MG tablet Take one the night before procedure and one the am of dental procedure 2 tablet 0   mometasone (ELOCON) 0.1 % cream Apply 1 application topically as needed.     OLANZapine zydis (ZYPREXA) 20 MG disintegrating tablet Take 1 tablet (20 mg total) by mouth 4 (four) times daily. 120 tablet 3   polyethylene glycol (MIRALAX / GLYCOLAX) packet Take 17 g by mouth daily. 14 each 0   No current facility-administered medications for this visit.     Musculoskeletal: Strength & Muscle Tone: na Gait & Station: na Patient leans: N/A  Psychiatric Specialty Exam: Review of Systems  HENT:  Positive for dental problem.   Psychiatric/Behavioral:  Positive for agitation and behavioral problems.   All other systems reviewed and are negative.  There were no vitals taken for this visit.There is no height or weight on file to calculate BMI.  General Appearance: NA  Eye Contact:  NA  Speech:  NA  Volume:   na  Mood:  NA  Affect:  NA  Thought Process:  NA  Orientation:  NA  Thought Content: NA   Suicidal Thoughts:  No  Homicidal Thoughts:  No  Memory:  NA  Judgement:  Impaired  Insight:  Lacking  Psychomotor Activity:  Restlessness  Concentration:  Concentration: NA and Attention Span: NA  Recall:  Poor  Fund of Knowledge: Poor  Language:  no speech  Akathisia:  NA  Handed:  Right  AIMS (if indicated): not done  Assets:  Physical Health Resilience Social Support  ADL's:  Impaired  Cognition: Impaired,  Moderate  Sleep:  Poor    Screenings:   Assessment and Plan: This patient is a 36 year old male with a history of moderate to severe cognitive impairment autism and a history of agitation.  He has been more agitated per mom due to his teeth hurting.  We will therefore increase clonazepam to 2 mg at bedtime for anxiety and sleep.  He will continue Zyprexa Zydis 20 mg 4 times daily for agitation and mood stabilization.  He will return to see me in 3 months   [name], MD 03/25/2021, 11:11 AM

## 2021-03-30 ENCOUNTER — Telehealth (HOSPITAL_COMMUNITY): Payer: Self-pay | Admitting: Psychiatry

## 2021-05-25 DIAGNOSIS — K029 Dental caries, unspecified: Secondary | ICD-10-CM | POA: Diagnosis not present

## 2021-05-25 DIAGNOSIS — F840 Autistic disorder: Secondary | ICD-10-CM | POA: Diagnosis not present

## 2021-05-25 DIAGNOSIS — I10 Essential (primary) hypertension: Secondary | ICD-10-CM | POA: Diagnosis not present

## 2021-05-25 DIAGNOSIS — K5901 Slow transit constipation: Secondary | ICD-10-CM | POA: Diagnosis not present

## 2021-06-22 ENCOUNTER — Encounter (HOSPITAL_COMMUNITY): Payer: Self-pay | Admitting: Psychiatry

## 2021-06-22 ENCOUNTER — Telehealth (INDEPENDENT_AMBULATORY_CARE_PROVIDER_SITE_OTHER): Payer: Medicare Other | Admitting: Psychiatry

## 2021-06-22 DIAGNOSIS — F39 Unspecified mood [affective] disorder: Secondary | ICD-10-CM

## 2021-06-22 DIAGNOSIS — F840 Autistic disorder: Secondary | ICD-10-CM

## 2021-06-22 MED ORDER — CLONAZEPAM 2 MG PO TBDP: 2.0000 mg | ORAL_TABLET | Freq: Every day | ORAL | 2 refills | Status: DC

## 2021-06-22 MED ORDER — OLANZAPINE 20 MG PO TBDP: 20.0000 mg | ORAL_TABLET | Freq: Four times a day (QID) | ORAL | 3 refills | Status: DC

## 2021-06-22 NOTE — Progress Notes (Signed)
Virtual Visit via Telephone Note ? ?I connected with Logan [name] on 06/22/21 at  9:00 AM EDT by telephone and verified that I am speaking with the correct person using two identifiers. ? ?Location: ?Patient: home ?Provider: office ?  ?I discussed the limitations, risks, security and privacy concerns of performing an evaluation and management service by telephone and the availability of in person appointments. I also discussed with the patient that there may be a patient responsible charge related to this service. The patient expressed understanding and agreed to proceed. ? ? ?  ?I discussed the assessment and treatment plan with the patient. The patient was provided an opportunity to ask questions and all were answered. The patient agreed with the plan and demonstrated an understanding of the instructions. ?  ?The patient was advised to call back or seek an in-person evaluation if the symptoms worsen or if the condition fails to improve as anticipated. ? ?I provided 15 minutes of non-face-to-face time during this encounter. ? ? ?Levonne Spiller, MD ? ?BH MD/PA/NP OP Progress Note ? ?06/22/2021 9:22 AM ?Logan [name]  ?MRN:  [id number] ? ?Chief Complaint:  ?Chief Complaint  ?Patient presents with  ? Agitation  ? Follow-up  ? ?HPI: This patient is a 36 year old single black male who lives with his mother  in [place]. He has completed the development disability program at [employer] and has nothing to do to occupy his time. The patient had been seen  in our [place] clinic but the mother wanted to bring him here to establish care. ? ?The patient's mother returns for follow-up after 3 months.  The patient is too impaired to participate in virtual sessions.  The patient is still struggling with dental issues.  The mother finally connected with an oral surgeon who is going to be able to pull some of his teeth in June.  In the interim he was given him amoxicillin.  Since he took the amoxicillin he is  feeling better and is eating better according to mom.  At times he gets a little bit hyperactive but he is not violent or agitated.  He is sleeping well.  They are going to have to move apartments because of work being done on their building and she is hopeful that he will get to out of line with the change. ?Visit Diagnosis:  ?  ICD-10-CM   ?1. Autism  F84.0   ?  ?2. Episodic mood disorder ([place])  F39   ?  ? ? ?Past Psychiatric History: Long-term outpatient treatment ? ?Past Medical History:  ?Past Medical History:  ?Diagnosis Date  ? ADHD (attention deficit hyperactivity disorder)   ? Autism   ?  ?Past Surgical History:  ?Procedure Laterality Date  ? MOUTH SURGERY    ? FILLING/TOOTH REPAIR  ? ? ?Family Psychiatric History: See below ? ?Family History:  ?Family History  ?Problem Relation Age of Onset  ? Autism Brother   ? Colon cancer Neg Hx   ? Colon polyps Neg Hx   ? ? ?Social History:  ?Social History  ? ?Socioeconomic History  ? Marital status: Single  ?  Spouse name: Not on file  ? Number of children: Not on file  ? Years of education: Not on file  ? Highest education level: Not on file  ?Occupational History  ? Not on file  ?Tobacco Use  ? Smoking status: Never  ? Smokeless tobacco: Never  ?Substance and Sexual Activity  ? Alcohol use: No  ?  Drug use: No  ? Sexual activity: Never  ?Other Topics Concern  ? Not on file  ?Social History Narrative  ? DIAGNOSED WITH AUTISM AT AGE 86. HAS ANOTHER AUTISTIC BROTHER. MOTHER CARES FOR HIM.  ? ?Social Determinants of Health  ? ?Financial Resource Strain: Not on file  ?Food Insecurity: Not on file  ?Transportation Needs: Not on file  ?Physical Activity: Not on file  ?Stress: Not on file  ?Social Connections: Not on file  ? ? ?Allergies: No Known Allergies ? ?Metabolic Disorder Labs: ?No results found for: HGBA1C, MPG ?No results found for: PROLACTIN ?No results found for: CHOL, TRIG, HDL, CHOLHDL, VLDL, LDLCALC ?No results found for: TSH ? ?Therapeutic Level Labs: ?No  results found for: LITHIUM ?No results found for: VALPROATE ?No components found for:  CBMZ ? ?Current Medications: ?Current Outpatient Medications  ?Medication Sig Dispense Refill  ? clonazepam (KLONOPIN) 2 MG disintegrating tablet Take 1 tablet (2 mg total) by mouth at bedtime. 30 tablet 2  ? cloNIDine (CATAPRES - DOSED IN MG/24 HR) 0.2 mg/24hr patch Place 0.2 mg onto the skin once a week.    ? linaclotide (LINZESS) 145 MCG CAPS capsule Take 145 mcg by mouth daily before breakfast. *Opens capsule and administers    ? LORazepam (ATIVAN) 1 MG tablet Take one the night before procedure and one the am of dental procedure 2 tablet 0  ? mometasone (ELOCON) 0.1 % cream Apply 1 application topically as needed.    ? OLANZapine zydis (ZYPREXA) 20 MG disintegrating tablet Take 1 tablet (20 mg total) by mouth 4 (four) times daily. 120 tablet 3  ? polyethylene glycol (MIRALAX / GLYCOLAX) packet Take 17 g by mouth daily. 14 each 0  ? ?No current facility-administered medications for this visit.  ? ? ? ?Musculoskeletal: ?Strength & Muscle Tone: na ?Gait & Station: na ?Patient leans: N/A ? ?Psychiatric Specialty Exam: ?Review of Systems  ?Psychiatric/Behavioral:  Positive for agitation.   ?All other systems reviewed and are negative.  ?There were no vitals taken for this visit.There is no height or weight on file to calculate BMI.  ?General Appearance: NA  ?Eye Contact:  NA  ?Speech:  NA  ?Volume:  na  ?Mood:  NA  ?Affect:  NA  ?Thought Process:  NA  ?Orientation:  NA  ?Thought Content: NA   ?Suicidal Thoughts:  No  ?Homicidal Thoughts:  No  ?Memory:  NA  ?Judgement:  Impaired  ?Insight:  Lacking  ?Psychomotor Activity:  NA  ?Concentration:  Concentration: Poor and Attention Span: Poor  ?Recall:  NA  ?Fund of Knowledge: Poor  ?Language:  none  ?Akathisia:  NA  ?Handed:  Right  ?AIMS (if indicated): not done  ?Assets:  Physical Health ?Resilience ?Social Support  ?ADL's:  Impaired  ?Cognition: Impaired,  Moderate  ?Sleep:  Good   ? ?Screenings: ? ? ?Assessment and Plan: This patient is a 36 year old male with a history of moderate to severe cognitive impairment, autism and a history of agitation.  Since he has been treated with an antibiotic he is a bit less agitated with his dental problems.  Hopefully this will be solved by the dental surgery.  For now he will continue clonazepam oral disintegrating tablet 2 mg at bedtime for anxiety and sleep as well as Zyprexa Zydis 20 mg 4 times daily for agitation and mood stabilization.  He will return to see me in 3 months ? ?Collaboration of Care: Collaboration of Care: Primary Care Provider AEB chart notes will  be shared with primary care physician at the mother's request ? ?Patient/Guardian was advised Release of Information must be obtained prior to any record release in order to collaborate their care with an outside provider. Patient/Guardian was advised if they have not already done so to contact the registration department to sign all necessary forms in order for [place] to release information regarding their care.  ? ?Consent: Patient/Guardian gives verbal consent for treatment and assignment of benefits for services provided during this visit. Patient/Guardian expressed understanding and agreed to proceed.  ? ? ?Levonne Spiller, MD ?06/22/2021, 9:22 AM ? ?

## 2021-08-24 DIAGNOSIS — K5901 Slow transit constipation: Secondary | ICD-10-CM | POA: Diagnosis not present

## 2021-08-24 DIAGNOSIS — N390 Urinary tract infection, site not specified: Secondary | ICD-10-CM | POA: Diagnosis not present

## 2021-08-24 DIAGNOSIS — F840 Autistic disorder: Secondary | ICD-10-CM | POA: Diagnosis not present

## 2021-08-24 DIAGNOSIS — I10 Essential (primary) hypertension: Secondary | ICD-10-CM | POA: Diagnosis not present

## 2021-08-24 DIAGNOSIS — R634 Abnormal weight loss: Secondary | ICD-10-CM | POA: Diagnosis not present

## 2021-08-24 DIAGNOSIS — Z13220 Encounter for screening for lipoid disorders: Secondary | ICD-10-CM | POA: Diagnosis not present

## 2021-08-24 DIAGNOSIS — Z87440 Personal history of urinary (tract) infections: Secondary | ICD-10-CM | POA: Diagnosis not present

## 2021-08-24 DIAGNOSIS — K029 Dental caries, unspecified: Secondary | ICD-10-CM | POA: Diagnosis not present

## 2021-08-25 DIAGNOSIS — K029 Dental caries, unspecified: Secondary | ICD-10-CM | POA: Diagnosis not present

## 2021-08-25 DIAGNOSIS — Z13220 Encounter for screening for lipoid disorders: Secondary | ICD-10-CM | POA: Diagnosis not present

## 2021-08-25 DIAGNOSIS — K5901 Slow transit constipation: Secondary | ICD-10-CM | POA: Diagnosis not present

## 2021-08-25 DIAGNOSIS — R634 Abnormal weight loss: Secondary | ICD-10-CM | POA: Diagnosis not present

## 2021-08-25 DIAGNOSIS — N390 Urinary tract infection, site not specified: Secondary | ICD-10-CM | POA: Diagnosis not present

## 2021-08-25 DIAGNOSIS — I10 Essential (primary) hypertension: Secondary | ICD-10-CM | POA: Diagnosis not present

## 2021-08-25 DIAGNOSIS — Z87440 Personal history of urinary (tract) infections: Secondary | ICD-10-CM | POA: Diagnosis not present

## 2021-09-16 ENCOUNTER — Encounter (HOSPITAL_COMMUNITY): Admission: RE | Payer: Self-pay | Source: Home / Self Care

## 2021-09-16 ENCOUNTER — Ambulatory Visit (HOSPITAL_COMMUNITY): Admission: RE | Admit: 2021-09-16 | Payer: Medicare Other | Source: Home / Self Care | Admitting: Dentistry

## 2021-09-16 SURGERY — DENTAL RESTORATION/EXTRACTION WITH X-RAY
Anesthesia: General

## 2021-09-22 ENCOUNTER — Telehealth (HOSPITAL_COMMUNITY): Payer: Medicare Other | Admitting: Psychiatry

## 2021-09-23 ENCOUNTER — Telehealth (INDEPENDENT_AMBULATORY_CARE_PROVIDER_SITE_OTHER): Payer: Medicare Other | Admitting: Psychiatry

## 2021-09-23 ENCOUNTER — Encounter (HOSPITAL_COMMUNITY): Payer: Self-pay | Admitting: Psychiatry

## 2021-09-23 DIAGNOSIS — F840 Autistic disorder: Secondary | ICD-10-CM

## 2021-09-23 DIAGNOSIS — F39 Unspecified mood [affective] disorder: Secondary | ICD-10-CM | POA: Diagnosis not present

## 2021-09-23 MED ORDER — OLANZAPINE 20 MG PO TBDP: 20.0000 mg | ORAL_TABLET | Freq: Four times a day (QID) | ORAL | 3 refills | Status: DC

## 2021-09-23 MED ORDER — CLONAZEPAM 2 MG PO TBDP: 2.0000 mg | ORAL_TABLET | Freq: Every day | ORAL | 2 refills | Status: DC

## 2021-09-23 NOTE — Progress Notes (Signed)
Virtual Visit via Telephone Note  I connected with Logan [name] on 09/23/21 at  3:20 PM EDT by telephone and verified that I am speaking with the correct person using two identifiers.  Location: Patient: home Provider: [facility]   I discussed the limitations, risks, security and privacy concerns of performing an evaluation and management service by telephone and the availability of in person appointments. I also discussed with the patient that there may be a patient responsible charge related to this service. The patient expressed understanding and agreed to proceed.     I discussed the assessment and treatment plan with the patient. The patient was provided an opportunity to ask questions and all were answered. The patient agreed with the plan and demonstrated an understanding of the instructions.   The patient was advised to call back or seek an in-person evaluation if the symptoms worsen or if the condition fails to improve as anticipated.  I provided 12 minutes of non-face-to-face time during this encounter.   [name], MD  [facility] MD/PA/NP OP Progress Note  09/23/2021 4:01 PM Logan [name]  MRN:  [id number]  Chief Complaint:  Chief Complaint  Patient presents with   Agitation   Anxiety   Follow-up   HPI: This patient is a 36 year old single black male who lives with his mother  in [place]. He has completed the development disability program at [employer] and has nothing to do to occupy his time. The patient had been seen  in our [place] clinic but the mother wanted to bring him here to establish care  The patient's mother returns for follow-up via phone.  The patient himself cannot participate as he has no speech.  She states that they recently moved because her last apartment had a lot of problems that needed repair.  He is having some difficulties adjusting to the new place but she thinks he will slowly get there.  At times he is more disobedient and says  the word "no" to her but he has not been violent or agitated.  He is sleeping and eating well.  His dental surgery has again gotten postponed.  He is followed closely by his PCP and there are no current medical issues.  The mother thinks that the medications are still helping his agitation and sleep. Visit Diagnosis:    ICD-10-CM   1. Autism  F84.0     2. Episodic mood disorder ([facility])  F39       Past Psychiatric History: Long-term outpatient treatment  Past Medical History:  Past Medical History:  Diagnosis Date   ADHD (attention deficit hyperactivity disorder)    Autism     Past Surgical History:  Procedure Laterality Date   MOUTH SURGERY     FILLING/TOOTH REPAIR    Family Psychiatric History: See below  Family History:  Family History  Problem Relation Age of Onset   Autism Brother    Colon cancer Neg Hx    Colon polyps Neg Hx     Social History:  Social History   Socioeconomic History   Marital status: Single    Spouse name: Not on file   Number of children: Not on file   Years of education: Not on file   Highest education level: Not on file  Occupational History   Not on file  Tobacco Use   Smoking status: Never   Smokeless tobacco: Never  Substance and Sexual Activity   Alcohol use: No   Drug use: No  Sexual activity: Never  Other Topics Concern   Not on file  Social History Narrative   DIAGNOSED WITH AUTISM AT AGE 49. HAS ANOTHER AUTISTIC BROTHER. MOTHER CARES FOR HIM.   Social Determinants of Health   Financial Resource Strain: Not on file  Food Insecurity: Not on file  Transportation Needs: Not on file  Physical Activity: Not on file  Stress: Not on file  Social Connections: Not on file    Allergies: No Known Allergies  Metabolic Disorder Labs: No results found for: "HGBA1C", "MPG" No results found for: "PROLACTIN" No results found for: "CHOL", "TRIG", "HDL", "CHOLHDL", "VLDL", "LDLCALC" No results found for: "TSH"  Therapeutic Level  Labs: No results found for: "LITHIUM" No results found for: "VALPROATE" No results found for: "CBMZ"  Current Medications: Current Outpatient Medications  Medication Sig Dispense Refill   clonazepam (KLONOPIN) 2 MG disintegrating tablet Take 1 tablet (2 mg total) by mouth at bedtime. 30 tablet 2   cloNIDine (CATAPRES - DOSED IN MG/24 HR) 0.2 mg/24hr patch Place 0.2 mg onto the skin once a week.     linaclotide (LINZESS) 145 MCG CAPS capsule Take 145 mcg by mouth daily before breakfast. *Opens capsule and administers     LORazepam (ATIVAN) 1 MG tablet Take one the night before procedure and one the am of dental procedure 2 tablet 0   mometasone (ELOCON) 0.1 % cream Apply 1 application topically as needed.     OLANZapine zydis (ZYPREXA) 20 MG disintegrating tablet Take 1 tablet (20 mg total) by mouth 4 (four) times daily. 120 tablet 3   polyethylene glycol (MIRALAX / GLYCOLAX) packet Take 17 g by mouth daily. 14 each 0   No current facility-administered medications for this visit.     Musculoskeletal: Strength & Muscle Tone: na Gait & Station: na Patient leans: N/A  Psychiatric Specialty Exam: Review of Systems  Psychiatric/Behavioral:  Positive for agitation.   All other systems reviewed and are negative.   There were no vitals taken for this visit.There is no height or weight on file to calculate BMI.  General Appearance: NA  Eye Contact:  NA  Speech:  na  Volume:  ma  Mood:  Irritable  Affect:  NA  Thought Process:  NA  Orientation:  NA  Thought Content: NA   Suicidal Thoughts:  No  Homicidal Thoughts:  No  Memory:  NA  Judgement:  Impaired  Insight:  Lacking  Psychomotor Activity:  Restlessness  Concentration:  Concentration: NA and Attention Span: NA  Recall:  NA  Fund of Knowledge: NA  Language:  no speech  Akathisia:  NA  Handed:  Right  AIMS (if indicated): not done  Assets:  Physical Health Resilience Social Support  ADL's:  Impaired  Cognition:  Impaired,  Severe  Sleep:  Good   Screenings:   Assessment and Plan: This patient is a 36 year old male with a history of moderate to severe cognitive impairment autism and a history of agitation.  He is having some increased agitation with the move but the mother states she can handle him without difficulty.  For now he will continue clonazepam oral disintegrating tablet 2 mg at bedtime for anxiety and sleep and Zyprexa Zydis 20 mg 4 times daily for agitation and mood stabilization.  He will return to see me in 3 months  Collaboration of Care: Collaboration of Care: Primary Care Provider AEB notes will be shared with PCP at mother's request  Patient/Guardian was advised Release of Information must be  obtained prior to any record release in order to collaborate their care with an outside provider. Patient/Guardian was advised if they have not already done so to contact the registration department to sign all necessary forms in order for [place] to release information regarding their care.   Consent: Patient/Guardian gives verbal consent for treatment and assignment of benefits for services provided during this visit. Patient/Guardian expressed understanding and agreed to proceed.    [name], MD 09/23/2021, 4:01 PM

## 2021-10-04 ENCOUNTER — Telehealth (HOSPITAL_COMMUNITY): Payer: Self-pay | Admitting: *Deleted

## 2021-10-04 NOTE — Telephone Encounter (Signed)
Patient have his surgery Sept 14th at [facility] hospital at 5:30 am. She just wanted provider to know. Dental Work

## 2021-10-25 ENCOUNTER — Ambulatory Visit: Payer: Self-pay | Admitting: Dentistry

## 2021-10-25 DIAGNOSIS — F840 Autistic disorder: Secondary | ICD-10-CM

## 2021-10-26 ENCOUNTER — Telehealth (HOSPITAL_COMMUNITY): Payer: Self-pay | Admitting: *Deleted

## 2021-10-26 NOTE — Telephone Encounter (Signed)
Patient can take the Klonopin before the procedure which is prescribed by Dr. Tenny Craw.  He does not need Ativan since already taking Klonopin.

## 2021-10-26 NOTE — Telephone Encounter (Signed)
Patient mother called stating she is needing LORazepam (ATIVAN) 1 MG tablet  for his procedure he is having on Thursday. Per pt mother, provider gives them this 2 tablet to help calm him down for procedures due to his Dx. Per pt chart, patient have had this script filled before for previous surgery. The pharmacy is [facility] in [place] on [address]. Patient procedure is on Thursday.

## 2021-10-26 NOTE — Telephone Encounter (Signed)
Spoke with patient mother and informed her and she verbalized understanding.

## 2021-10-27 ENCOUNTER — Encounter (HOSPITAL_COMMUNITY): Payer: Self-pay | Admitting: Dentistry

## 2021-10-27 NOTE — Progress Notes (Signed)
PCP - Dr Mirna [facility] - Dr [name] Cardiologist - n/a  Chest x-ray - n/a EKG - n/a Stress Test - n/a ECHO - n/a Cardiac Cath - n/a  ICD Pacemaker/Loop - n/a  Sleep Study -  n/a CPAP - none  Anesthesia review: Yes  STOP now taking any Aspirin (unless otherwise instructed by your surgeon), Aleve, Naproxen, Ibuprofen, Motrin, Advil, Goody's, BC's, all herbal medications, fish oil, and all vitamins.   Coronavirus Screening Do you have any of the following symptoms:  Cough yes/no: No Fever (>100.55F)  yes/no: No Runny nose yes/no: No Sore throat yes/no: No Difficulty breathing/shortness of breath  yes/no: No  Have you traveled in the last 14 days and where? yes/no: No  Patient's mother Logan [name] verbalized understanding of instructions that were given via phone.

## 2021-10-28 ENCOUNTER — Ambulatory Visit (HOSPITAL_COMMUNITY)
Admission: RE | Admit: 2021-10-28 | Discharge: 2021-10-28 | Disposition: A | Discharge status: Home / Self Care | Payer: Medicare Other | Attending: Dentistry | Admitting: Dentistry

## 2021-10-28 ENCOUNTER — Ambulatory Visit (HOSPITAL_COMMUNITY): Payer: Medicare Other | Admitting: Vascular Surgery

## 2021-10-28 ENCOUNTER — Encounter (HOSPITAL_COMMUNITY)
Admission: RE | Disposition: A | Discharge status: Home / Self Care | Payer: Self-pay | Source: Home / Self Care | Attending: Dentistry

## 2021-10-28 ENCOUNTER — Ambulatory Visit (HOSPITAL_BASED_OUTPATIENT_CLINIC_OR_DEPARTMENT_OTHER): Payer: Medicare Other | Admitting: Vascular Surgery

## 2021-10-28 ENCOUNTER — Other Ambulatory Visit: Payer: Self-pay

## 2021-10-28 ENCOUNTER — Encounter (HOSPITAL_COMMUNITY): Payer: Self-pay | Admitting: Dentistry

## 2021-10-28 DIAGNOSIS — K029 Dental caries, unspecified: Secondary | ICD-10-CM | POA: Insufficient documentation

## 2021-10-28 DIAGNOSIS — F79 Unspecified intellectual disabilities: Secondary | ICD-10-CM | POA: Diagnosis not present

## 2021-10-28 DIAGNOSIS — I10 Essential (primary) hypertension: Secondary | ICD-10-CM | POA: Diagnosis not present

## 2021-10-28 DIAGNOSIS — F840 Autistic disorder: Secondary | ICD-10-CM

## 2021-10-28 HISTORY — DX: Constipation, unspecified: K59.00

## 2021-10-28 HISTORY — PX: DENTAL RESTORATION/EXTRACTION WITH X-RAY: SHX5796

## 2021-10-28 HISTORY — DX: Unspecified urinary incontinence: R32

## 2021-10-28 HISTORY — DX: Anorexia: R63.0

## 2021-10-28 HISTORY — DX: Essential (primary) hypertension: I10

## 2021-10-28 SURGERY — DENTAL RESTORATION/EXTRACTION WITH X-RAY
Anesthesia: General

## 2021-10-28 MED ORDER — PROPOFOL 10 MG/ML IV BOLUS
INTRAVENOUS | Status: AC
  Filled 2021-10-28: qty 20

## 2021-10-28 MED ORDER — PROPOFOL 10 MG/ML IV BOLUS
INTRAVENOUS | Status: DC | PRN
  Administered 2021-10-28: 70 mg via INTRAVENOUS

## 2021-10-28 MED ORDER — LIDOCAINE-EPINEPHRINE 2 %-1:100000 IJ SOLN
INTRAMUSCULAR | Status: AC
  Filled 2021-10-28: qty 1

## 2021-10-28 MED ORDER — ROCURONIUM BROMIDE 10 MG/ML (PF) SYRINGE
PREFILLED_SYRINGE | INTRAVENOUS | Status: AC
  Filled 2021-10-28: qty 10

## 2021-10-28 MED ORDER — CEFAZOLIN SODIUM-DEXTROSE 2-3 GM-%(50ML) IV SOLR
INTRAVENOUS | Status: DC | PRN
  Administered 2021-10-28: 2 g via INTRAVENOUS

## 2021-10-28 MED ORDER — KETAMINE HCL 100 MG/ML IJ SOLN
INTRAMUSCULAR | Status: DC | PRN
  Administered 2021-10-28: 300 mg via INTRAMUSCULAR

## 2021-10-28 MED ORDER — PHENYLEPHRINE 80 MCG/ML (10ML) SYRINGE FOR IV PUSH (FOR BLOOD PRESSURE SUPPORT)
PREFILLED_SYRINGE | INTRAVENOUS | Status: DC | PRN
  Administered 2021-10-28 (×2): 160 ug via INTRAVENOUS

## 2021-10-28 MED ORDER — DEXAMETHASONE SODIUM PHOSPHATE 10 MG/ML IJ SOLN
INTRAMUSCULAR | Status: DC | PRN
  Administered 2021-10-28: 10 mg via INTRAVENOUS

## 2021-10-28 MED ORDER — STERILE WATER FOR IRRIGATION IR SOLN
Status: DC | PRN
  Administered 2021-10-28: 2000 mL

## 2021-10-28 MED ORDER — MIDAZOLAM HCL 2 MG/2ML IJ SOLN
INTRAMUSCULAR | Status: AC
  Filled 2021-10-28: qty 2

## 2021-10-28 MED ORDER — SUGAMMADEX SODIUM 200 MG/2ML IV SOLN
INTRAVENOUS | Status: DC | PRN
  Administered 2021-10-28: 200 mg via INTRAVENOUS

## 2021-10-28 MED ORDER — KETAMINE HCL 100 MG/ML IJ SOLN
INTRAMUSCULAR | Status: AC
  Filled 2021-10-28: qty 1

## 2021-10-28 MED ORDER — OXYMETAZOLINE HCL 0.05 % NA SOLN
NASAL | Status: AC
  Filled 2021-10-28: qty 30

## 2021-10-28 MED ORDER — KETOROLAC TROMETHAMINE 30 MG/ML IJ SOLN
INTRAMUSCULAR | Status: DC | PRN
  Administered 2021-10-28: 30 mg via INTRAVENOUS

## 2021-10-28 MED ORDER — OXYMETAZOLINE HCL 0.05 % NA SOLN
NASAL | Status: DC | PRN
  Administered 2021-10-28: 1

## 2021-10-28 MED ORDER — CHLORHEXIDINE GLUCONATE 0.12 % MT SOLN: 15.0000 mL | Freq: Once | OROMUCOSAL | Status: DC

## 2021-10-28 MED ORDER — ORAL CARE MOUTH RINSE: 15.0000 mL | Freq: Once | OROMUCOSAL | Status: DC

## 2021-10-28 MED ORDER — ONDANSETRON HCL 4 MG/2ML IJ SOLN
INTRAMUSCULAR | Status: DC | PRN
  Administered 2021-10-28: 4 mg via INTRAVENOUS

## 2021-10-28 MED ORDER — LIDOCAINE-EPINEPHRINE 2 %-1:100000 IJ SOLN
INTRAMUSCULAR | Status: AC
  Filled 2021-10-28: qty 1.7

## 2021-10-28 MED ORDER — FENTANYL CITRATE (PF) 100 MCG/2ML IJ SOLN: 25.0000 ug | INTRAMUSCULAR | Status: DC | PRN

## 2021-10-28 MED ORDER — FENTANYL CITRATE (PF) 250 MCG/5ML IJ SOLN
INTRAMUSCULAR | Status: AC
  Filled 2021-10-28: qty 5

## 2021-10-28 MED ORDER — CEFAZOLIN SODIUM 1 G IJ SOLR
INTRAMUSCULAR | Status: AC
  Filled 2021-10-28: qty 10

## 2021-10-28 MED ORDER — LIDOCAINE 2% (20 MG/ML) 5 ML SYRINGE
INTRAMUSCULAR | Status: AC
  Filled 2021-10-28: qty 5

## 2021-10-28 MED ORDER — LIDOCAINE 2% (20 MG/ML) 5 ML SYRINGE
INTRAMUSCULAR | Status: DC | PRN
  Administered 2021-10-28: 60 mg via INTRAVENOUS

## 2021-10-28 MED ORDER — MIDAZOLAM HCL 2 MG/2ML IJ SOLN
INTRAMUSCULAR | Status: DC | PRN
  Administered 2021-10-28: 2 mg via INTRAVENOUS

## 2021-10-28 MED ORDER — LACTATED RINGERS IV SOLN: INTRAVENOUS | Status: DC

## 2021-10-28 MED ORDER — HEMOSTATIC AGENTS (NO CHARGE) OPTIME
TOPICAL | Status: DC | PRN
  Administered 2021-10-28: 1 via TOPICAL

## 2021-10-28 MED ORDER — LIDOCAINE-EPINEPHRINE 2 %-1:100000 IJ SOLN
INTRAMUSCULAR | Status: DC | PRN
  Administered 2021-10-28: 13 mL via INTRADERMAL
  Administered 2021-10-28: 1.7 mL via INTRADERMAL

## 2021-10-28 MED ORDER — ONDANSETRON HCL 4 MG/2ML IJ SOLN
INTRAMUSCULAR | Status: AC
  Filled 2021-10-28: qty 2

## 2021-10-28 MED ORDER — DEXAMETHASONE SODIUM PHOSPHATE 10 MG/ML IJ SOLN
INTRAMUSCULAR | Status: AC
  Filled 2021-10-28: qty 1

## 2021-10-28 MED ORDER — FENTANYL CITRATE (PF) 250 MCG/5ML IJ SOLN
INTRAMUSCULAR | Status: DC | PRN
  Administered 2021-10-28: 50 ug via INTRAVENOUS

## 2021-10-28 MED ORDER — ROCURONIUM BROMIDE 10 MG/ML (PF) SYRINGE
PREFILLED_SYRINGE | INTRAVENOUS | Status: DC | PRN
  Administered 2021-10-28 (×2): 50 mg via INTRAVENOUS

## 2021-10-28 MED ORDER — OXYMETAZOLINE HCL 0.05 % NA SOLN
NASAL | Status: DC | PRN
  Administered 2021-10-28 (×2): 2 via NASAL

## 2021-10-28 MED ORDER — EPHEDRINE SULFATE-NACL 50-0.9 MG/10ML-% IV SOSY
PREFILLED_SYRINGE | INTRAVENOUS | Status: DC | PRN
  Administered 2021-10-28: 5 mg via INTRAVENOUS
  Administered 2021-10-28 (×2): 10 mg via INTRAVENOUS

## 2021-10-28 SURGICAL SUPPLY — 33 items
BAG COUNTER SPONGE SURGICOUNT (BAG) IMPLANT
BLADE SURG 15 STRL LF DISP TIS (BLADE) IMPLANT
BLADE SURG 15 STRL SS (BLADE) ×1
BUR ROUND PRECISION 4.0 (BURR) ×1 IMPLANT
CANISTER SUCT 3000ML PPV (MISCELLANEOUS) ×1 IMPLANT
COVER BACK TABLE 60X90IN (DRAPES) ×1 IMPLANT
COVER MAYO STAND STRL (DRAPES) ×1 IMPLANT
COVER SURGICAL LIGHT HANDLE (MISCELLANEOUS) ×1 IMPLANT
DRAPE HALF SHEET 40X57 (DRAPES) ×1 IMPLANT
ELECT REM PT RETURN 9FT ADLT (ELECTROSURGICAL) ×1
ELECTRODE REM PT RTRN 9FT ADLT (ELECTROSURGICAL) IMPLANT
GAUZE 4X4 16PLY ~~LOC~~+RFID DBL (SPONGE) ×1 IMPLANT
GAUZE PACKING FOLDED 2  STR (GAUZE/BANDAGES/DRESSINGS)
GAUZE PACKING FOLDED 2 STR (GAUZE/BANDAGES/DRESSINGS) IMPLANT
GLOVE BIO SURGEON STRL SZ 6.5 (GLOVE) ×1 IMPLANT
GLOVE BIO SURGEON STRL SZ7.5 (GLOVE) ×1 IMPLANT
GLOVE ECLIPSE 7.5 STRL STRAW (GLOVE) IMPLANT
GOWN STRL REUS W/ TWL LRG LVL3 (GOWN DISPOSABLE) ×2 IMPLANT
GOWN STRL REUS W/TWL LRG LVL3 (GOWN DISPOSABLE) ×2
KIT BASIN OR (CUSTOM PROCEDURE TRAY) ×1 IMPLANT
KIT TURNOVER KIT B (KITS) ×1 IMPLANT
NDL 25GX 5/8IN NON SAFETY (NEEDLE) ×1 IMPLANT
NEEDLE 25GX 5/8IN NON SAFETY (NEEDLE) ×1 IMPLANT
PENCIL BUTTON HOLSTER BLD 10FT (ELECTRODE) IMPLANT
SPONGE SURGIFOAM ABS GEL 12-7 (HEMOSTASIS) IMPLANT
SUT CHROMIC 3 0 PS 2 (SUTURE) IMPLANT
SYR CONTROL 10ML LL (SYRINGE) ×1 IMPLANT
TOOTHBRUSH ADULT (PERSONAL CARE ITEMS) IMPLANT
TOWEL GREEN STERILE (TOWEL DISPOSABLE) ×1 IMPLANT
TUBE CONNECTING 12X1/4 (SUCTIONS) ×1 IMPLANT
WATER STERILE IRR 1000ML POUR (IV SOLUTION) IMPLANT
WATER TABLETS ICX (MISCELLANEOUS) ×1 IMPLANT
YANKAUER SUCT BULB TIP NO VENT (SUCTIONS) ×1 IMPLANT

## 2021-10-28 NOTE — OR Nursing (Signed)
Logan Cowman, RN contacted mother Logan [name]) on cell phone at [phone] to give her procedure updates.

## 2021-10-28 NOTE — Anesthesia Procedure Notes (Signed)
Procedure Name: Intubation Date/Time: 10/28/2021 7:56 AM  Performed by: Rosiland Oz, CRNAPre-anesthesia Checklist: Patient identified, Emergency Drugs available, Suction available, Patient being monitored and Timeout performed Patient Re-evaluated:Patient Re-evaluated prior to induction Oxygen Delivery Method: Circle system utilized Preoxygenation: Pre-oxygenation with 100% oxygen Induction Type: Inhalational induction Ventilation: Mask ventilation without difficulty Laryngoscope Size: Miller and 3 Grade View: Grade I Nasal Tubes: Right, Nasal prep performed and Nasal Rae Tube size: 7.5 mm Number of attempts: 1 Airway Equipment and Method: Stylet Placement Confirmation: ETT inserted through vocal cords under direct vision, positive ETCO2 and breath sounds checked- equal and bilateral Tube secured with: Tape Dental Injury: Teeth and Oropharynx as per pre-operative assessment

## 2021-10-28 NOTE — Anesthesia Preprocedure Evaluation (Signed)
Anesthesia Evaluation  Patient identified by MRN, date of birth, ID band Patient awake  General Assessment Comment:History noted Dr. Chilton Si  Reviewed: Allergy & Precautions, NPO status , Patient's Chart, lab work & pertinent test results  Airway Mallampati: II       Dental   Pulmonary    breath sounds clear to auscultation       Cardiovascular hypertension,  Rhythm:Regular Rate:Normal     Neuro/Psych PSYCHIATRIC DISORDERS    GI/Hepatic negative GI ROS, Neg liver ROS,   Endo/Other  negative endocrine ROS  Renal/GU negative Renal ROS     Musculoskeletal   Abdominal   Peds  Hematology   Anesthesia Other Findings   Reproductive/Obstetrics                             Anesthesia Physical Anesthesia Plan  ASA: 3  Anesthesia Plan: General   Post-op Pain Management:    Induction: Intravenous  PONV Risk Score and Plan: 2 and Ondansetron, Midazolam and Dexamethasone  Airway Management Planned: Oral ETT and Nasal ETT  Additional Equipment:   Intra-op Plan:   Post-operative Plan: Extubation in OR  Informed Consent: I have reviewed the patients History and Physical, chart, labs and discussed the procedure including the risks, benefits and alternatives for the proposed anesthesia with the patient or authorized representative who has indicated his/her understanding and acceptance.     Dental advisory given  Plan Discussed with: CRNA and Anesthesiologist  Anesthesia Plan Comments:         Anesthesia Quick Evaluation

## 2021-10-28 NOTE — Op Note (Signed)
[facility]  10/28/2021 Jlynn Langille Mahoney [id number]  Preop DX: Dental caries/behavior management issues due to intellectual disabilities/developmental disabilities. Dental Care provided in OR for medically necessary treatment.  Surgeon: Esaw Dace, DDS  Assistant: Shawnie Pons and hospital staff.  Anesthesia: General  Procedure: The patient was brought into the operating room and placed on the table in a supine position.  General anesthesia was administered via nasal intubation.  The patient was prepped and draped in the usual manner for an intra-oral general dentistry procedure. The oropharynx was suctioned and a moistened oropharyngeal throat pack was placed.    A full intra-oral exam including all hard and soft tissues was performed.  Type of Exam: Initial   Soft Tissue Exam: Floor of the mouth: Normal Buccal mucosa: Normal Soft palate: Normal Hard palate: Normal Tongue: Normal Gingival: Normal Frenum: Normal  Hard tissue exam:  Present: # 2-15, 18-31 Missing: # 1, 2,72,53,66 Un-erupted: # none Radiographic findings decay: # [id number] Radiographic findings abscess: # 9,25  Full mouth series of digital radiographs taken and reviewed. A comprehensive treatment plan was developed.  Operative care was accomplished in a standard fashion using high/low speed drills with copious irrigation.  Routine extractions were accomplished with simple elevation and use of forceps. Surgical Extractions were done in a standard fashion with full facial thickness flaps to gain access, otectomy / osteoplasty with copious irrigation to expose the teeth. Teeth with multi-roots were sectioned as needed to minimize surgical trauma.  No  All surgical sites were irrigated with copious amounts of saline. Gel foam was placed in the sockets and hemostasis established with firm pressure. Surgical sites were closed with 3-0 Chromic sutures.  Local Anes:Lidocaine  2% with 1:100,000 epinephrine 13 mls The estimated blood loss was 25 mls.   Upon completion of all procedures the oropharynx was irrigated of all debris. Mouth was suctioned dry and a posterior throat pack was carefully removed with constant suction. Hemostasis was established and a gauze pack was placed as an intraoral pressure dressing. After spontaneous respirations the patient was extubated and transported to the Post-Anesthesia care unit in awake but in a sedated condition. The patient tolerated the procedure well and without complications.  An explanation of procedures and extractions were given to his mother.  Operative Procedures:  Full mouth debridement: Yes Extractions completed: # 9,25 Amalgams: # none Composites/Glass Ionomers: # 2 OB,4 OB,5 OB,6 MFD,7 MFD,11 MFD,12 MFD,13 OB,15 O,18 OB,20 M,21 F,22 FMD,23 FMD,24 DLBM,26 MFDLI,30 OB,31 MODLB Fluoride varnish: yes Gingivectomy: No Alveloplasty: No Crown: # none Bridge: # none  Postoperative Meds:  Tylenol 500 mg, 2 tabs every 4-6 hours for post operative pain.  Postoperative Instructions: Extraction sheet signed and given to patient representative.    Deona Novitski JR,Mcclellan Demarais E, DDS

## 2021-10-28 NOTE — Brief Op Note (Signed)
10/28/2021  12:13 PM  PATIENT:  Logan [name]  36 y.o. male  PRE-OPERATIVE DIAGNOSIS:  DENTAL CARIES  POST-OPERATIVE DIAGNOSIS:  DENTAL CARIES  PROCEDURE:  Procedure(s): DENTAL RESTORATION/EXTRACTION OF #9, AND #25 WITH RESIN AND FULL MOUTH X-RAY AND DEBRIDEMENT (N/A)  SURGEON:  Surgeon(s) and Role:    * Boneta Lucks, DDS - Primary  PHYSICIAN ASSISTANT:   ASSISTANTS: Shawnie Pons  ANESTHESIA:   general  EBL:  25 mL   BLOOD ADMINISTERED:none  DRAINS: none   LOCAL MEDICATIONS USED:  LIDOCAINE   SPECIMEN:  No Specimen  DISPOSITION OF SPECIMEN:  N/A  COUNTS:  YES  TOURNIQUET:  * No tourniquets in log *  DICTATION: .Note written in EPIC  PLAN OF CARE: Discharge to home after PACU  PATIENT DISPOSITION:  PACU - hemodynamically stable.   Delay start of Pharmacological VTE agent (>24hrs) due to surgical blood loss or risk of bleeding: not applicable

## 2021-10-28 NOTE — Transfer of Care (Signed)
Immediate Anesthesia Transfer of Care Note  Patient: Logan [name]  Procedure(s) Performed: DENTAL RESTORATION/EXTRACTION OF #9, AND #25 WITH RESIN AND FULL MOUTH X-RAY AND DEBRIDEMENT  Patient Location: PACU  Anesthesia Type:General  Level of Consciousness: drowsy and patient cooperative  Airway & Oxygen Therapy: Patient Spontanous Breathing  Post-op Assessment: Report given to RN and Post -op Vital signs reviewed and stable  Post vital signs: Reviewed and stable  Last Vitals:  Vitals Value Taken Time  BP    Temp    Pulse    Resp    SpO2      Last Pain:  Vitals:   10/28/21 0605  TempSrc: Oral         Complications: No notable events documented.

## 2021-10-28 NOTE — Anesthesia Postprocedure Evaluation (Signed)
Anesthesia Post Note  Patient: Logan [name]  Procedure(s) Performed: DENTAL RESTORATION/EXTRACTION OF #9, AND #25 WITH RESIN AND FULL MOUTH X-RAY AND DEBRIDEMENT     Patient location during evaluation: PACU Anesthesia Type: General Level of consciousness: awake Pain management: pain level controlled Vital Signs Assessment: post-procedure vital signs reviewed and stable Respiratory status: spontaneous breathing Cardiovascular status: stable Postop Assessment: no apparent nausea or vomiting Anesthetic complications: no   No notable events documented.  Last Vitals:  Vitals:   10/28/21 1400 10/28/21 1415  BP: 131/85 117/88  Pulse: 69 72  Resp:    Temp:    SpO2: 98% 97%    Last Pain:  Vitals:   10/28/21 1245  TempSrc:   PainSc: Asleep                 Aja Whitehair

## 2021-10-28 NOTE — H&P (Signed)
H&P reviewed, stable for surgery. Logan [name]  

## 2021-10-29 ENCOUNTER — Encounter (HOSPITAL_COMMUNITY): Payer: Self-pay | Admitting: Dentistry

## 2021-11-02 ENCOUNTER — Telehealth (HOSPITAL_COMMUNITY): Payer: Self-pay | Admitting: *Deleted

## 2021-11-02 NOTE — Telephone Encounter (Signed)
Great, thanks

## 2021-11-02 NOTE — Telephone Encounter (Signed)
Per pt mother to let provider know that patient dental surgery went well and thank you for her support all that she does for [facility].

## 2021-11-30 DIAGNOSIS — K5901 Slow transit constipation: Secondary | ICD-10-CM | POA: Diagnosis not present

## 2021-11-30 DIAGNOSIS — I10 Essential (primary) hypertension: Secondary | ICD-10-CM | POA: Diagnosis not present

## 2021-11-30 DIAGNOSIS — F840 Autistic disorder: Secondary | ICD-10-CM | POA: Diagnosis not present

## 2021-12-24 ENCOUNTER — Encounter (HOSPITAL_COMMUNITY): Payer: Self-pay | Admitting: Psychiatry

## 2021-12-24 ENCOUNTER — Telehealth (INDEPENDENT_AMBULATORY_CARE_PROVIDER_SITE_OTHER): Payer: Medicare Other | Admitting: Psychiatry

## 2021-12-24 DIAGNOSIS — F39 Unspecified mood [affective] disorder: Secondary | ICD-10-CM

## 2021-12-24 DIAGNOSIS — F840 Autistic disorder: Secondary | ICD-10-CM | POA: Diagnosis not present

## 2021-12-24 MED ORDER — OLANZAPINE 20 MG PO TBDP: 20.0000 mg | ORAL_TABLET | Freq: Four times a day (QID) | ORAL | 3 refills | Status: DC

## 2021-12-24 MED ORDER — CLONAZEPAM 2 MG PO TBDP: 2.0000 mg | ORAL_TABLET | Freq: Every day | ORAL | 2 refills | Status: DC

## 2021-12-24 NOTE — Progress Notes (Signed)
Virtual Visit via Telephone Note  I connected with Logan [name] on 12/24/21 at 10:20 AM EST by telephone and verified that I am speaking with the correct person using two identifiers.  Location: Patient: home Provider: office   I discussed the limitations, risks, security and privacy concerns of performing an evaluation and management service by telephone and the availability of in person appointments. I also discussed with the patient that there may be a patient responsible charge related to this service. The patient expressed understanding and agreed to proceed.      I discussed the assessment and treatment plan with the patient. The patient was provided an opportunity to ask questions and all were answered. The patient agreed with the plan and demonstrated an understanding of the instructions.   The patient was advised to call back or seek an in-person evaluation if the symptoms worsen or if the condition fails to improve as anticipated.  I provided 15 minutes of non-face-to-face time during this encounter.   Logan Ruder, MD  [facility] MD/PA/NP OP Progress Note  12/24/2021 11:40 AM Logan [name]  MRN:  [id number]  Chief Complaint:  Chief Complaint  Patient presents with   Agitation   Follow-up   HPI: This patient is a 36 year old single black male who lives with his mother  in [place]. He has completed the development disability program at [employer] and has nothing to do to occupy his time. The patient had been seen  in our [place] clinic but the mother wanted to bring him here to establish care   The patient's mother returns for follow-up via phone after 3 months.  The patient got through his dental surgery okay.  He has severe constipation for a while that has been relieved by MiraLAX.  He seems to be in good spirits according to mom.  He does tend to steal food but other than that his behavior is under fairly good control.  He is sleeping and eating well.   He is followed closely by his PCP and there are no current medical issues.  The mother thinks the medications are still helping with his agitation and sleep Visit Diagnosis:    ICD-10-CM   1. Autism  F84.0     2. Episodic mood disorder (HCC)  F39       Past Psychiatric History: Long-term outpatient treatment  Past Medical History:  Past Medical History:  Diagnosis Date   ADHD (attention deficit hyperactivity disorder)    Autism    Constipation    and chronic slow transit   Hypertension    Poor appetite    weight loss - tx w/ensure   Urinary incontinence     Past Surgical History:  Procedure Laterality Date   DENTAL RESTORATION/EXTRACTION WITH X-RAY N/A 10/28/2021   Procedure: DENTAL RESTORATION/EXTRACTION OF #9, AND #25 WITH RESIN AND FULL MOUTH X-RAY AND DEBRIDEMENT;  Surgeon: Boneta Lucks, DDS;  [facility];  Service: Dentistry;  Laterality: N/A;   MOUTH SURGERY     FILLING/TOOTH REPAIR    Family Psychiatric History: See below  Family History:  Family History  Problem Relation Age of Onset   Autism Brother    Colon cancer Neg Hx    Colon polyps Neg Hx     Social History:  Social History   Socioeconomic History   Marital status: Single    Spouse name: Not on file   Number of children: Not on file   Years of education: Not on file  Highest education level: Not on file  Occupational History   Not on file  Tobacco Use   Smoking status: Never   Smokeless tobacco: Never  Vaping Use   Vaping Use: Never used  Substance and Sexual Activity   Alcohol use: No   Drug use: No   Sexual activity: Never  Other Topics Concern   Not on file  Social History Narrative   DIAGNOSED WITH AUTISM AT AGE 89. HAS ANOTHER AUTISTIC BROTHER. MOTHER CARES FOR HIM.   Social Determinants of Health   Financial Resource Strain: Not on file  Food Insecurity: Not on file  Transportation Needs: Not on file  Physical Activity: Not on file  Stress: Not on file  Social  Connections: Not on file    Allergies: No Known Allergies  Metabolic Disorder Labs: No results found for: "HGBA1C", "MPG" No results found for: "PROLACTIN" No results found for: "CHOL", "TRIG", "HDL", "CHOLHDL", "VLDL", "LDLCALC" No results found for: "TSH"  Therapeutic Level Labs: No results found for: "LITHIUM" No results found for: "VALPROATE" No results found for: "CBMZ"  Current Medications: Current Outpatient Medications  Medication Sig Dispense Refill   acetaminophen (TYLENOL) 500 MG tablet Take 500 mg by mouth every 6 (six) hours as needed (pain.).     clonazepam (KLONOPIN) 2 MG disintegrating tablet Take 1 tablet (2 mg total) by mouth at bedtime. 30 tablet 2   cloNIDine (CATAPRES - DOSED IN MG/24 HR) 0.2 mg/24hr patch Place 0.2 mg onto the skin once a week. Tuesday     mometasone (ELOCON) 0.1 % cream Apply 1 application  topically daily as needed (blemishes (facial)).     OLANZapine zydis (ZYPREXA) 20 MG disintegrating tablet Take 1 tablet (20 mg total) by mouth 4 (four) times daily. 120 tablet 3   polyethylene glycol (MIRALAX / GLYCOLAX) packet Take 17 g by mouth daily. 14 each 0   No current facility-administered medications for this visit.     Musculoskeletal: Strength & Muscle Tone: na Gait & Station: na Patient leans: N/A  Psychiatric Specialty Exam: Review of Systems  Psychiatric/Behavioral:  Positive for agitation and behavioral problems.   All other systems reviewed and are negative.   There were no vitals taken for this visit.There is no height or weight on file to calculate BMI.  General Appearance: NA  Eye Contact:  NA  Speech: none  Volume:  none  Mood:  NA  Affect:  NA  Thought Process:  NA  Orientation:  NA  Thought Content: NA   Suicidal Thoughts:  No  Homicidal Thoughts:  No  Memory:  NA  Judgement:  Poor  Insight:  Lacking  Psychomotor Activity:  NA  Concentration:  Concentration: Poor and Attention Span: Poor  Recall:  Poor  Fund of  Knowledge: Poor  Language: NA  Akathisia:  No  Handed:  Right  AIMS (if indicated): not done  Assets:  Physical Health Resilience Social Support  ADL's:  Impaired  Cognition: Impaired,  Moderate  Sleep:  Good   Screenings: Flowsheet Row Admission (Discharged) from 10/28/2021 in [place] PERIOPERATIVE AREA  C-SSRS RISK CATEGORY No Risk        Assessment and Plan: Patient is a 36 year old male with a history of moderate to severe cognitive impairment, autism and a history of agitation.  Overall he has been stable according to mom.  He will continue clonazepam oral disintegrating tablet 2 mg at bedtime for anxiety and sleep and Zyprexa Zydis 20 mg 4 times daily for agitation  and mood stabilization.  He will return to see me in 3 months  Collaboration of Care: Collaboration of Care: Primary Care Provider AEB notes will be shared with PCP at mother's request  Patient/Guardian was advised Release of Information must be obtained prior to any record release in order to collaborate their care with an outside provider. Patient/Guardian was advised if they have not already done so to contact the registration department to sign all necessary forms in order for [place] to release information regarding their care.   Consent: Patient/Guardian gives verbal consent for treatment and assignment of benefits for services provided during this visit. Patient/Guardian expressed understanding and agreed to proceed.    Logan Ruder, MD 12/24/2021, 11:40 AM

## 2022-03-01 DIAGNOSIS — J019 Acute sinusitis, unspecified: Secondary | ICD-10-CM | POA: Diagnosis not present

## 2022-03-01 DIAGNOSIS — R0981 Nasal congestion: Secondary | ICD-10-CM | POA: Diagnosis not present

## 2022-03-31 ENCOUNTER — Telehealth (INDEPENDENT_AMBULATORY_CARE_PROVIDER_SITE_OTHER): Payer: Medicare Other | Admitting: Psychiatry

## 2022-03-31 ENCOUNTER — Encounter (HOSPITAL_COMMUNITY): Payer: Self-pay | Admitting: Psychiatry

## 2022-03-31 DIAGNOSIS — F840 Autistic disorder: Secondary | ICD-10-CM

## 2022-03-31 DIAGNOSIS — F39 Unspecified mood [affective] disorder: Secondary | ICD-10-CM

## 2022-03-31 MED ORDER — OLANZAPINE 20 MG PO TBDP: 20.0000 mg | ORAL_TABLET | Freq: Four times a day (QID) | ORAL | 3 refills | Status: DC

## 2022-03-31 MED ORDER — CLONAZEPAM 2 MG PO TBDP: 2.0000 mg | ORAL_TABLET | Freq: Every day | ORAL | 3 refills | Status: DC

## 2022-03-31 NOTE — Progress Notes (Signed)
Virtual Visit via Telephone Note  I connected with Logan [name] on 03/31/22 at 11:00 AM EST by telephone and verified that I am speaking with the correct person using two identifiers.  Location: Patient: home Provider: office   I discussed the limitations, risks, security and privacy concerns of performing an evaluation and management service by telephone and the availability of in person appointments. I also discussed with the patient that there may be a patient responsible charge related to this service. The patient expressed understanding and agreed to proceed.      I discussed the assessment and treatment plan with the patient. The patient was provided an opportunity to ask questions and all were answered. The patient agreed with the plan and demonstrated an understanding of the instructions.   The patient was advised to call back or seek an in-person evaluation if the symptoms worsen or if the condition fails to improve as anticipated.  I provided 15 minutes of non-face-to-face time during this encounter.   Levonne Spiller, MD  [facility] MD/PA/NP OP Progress Note  03/31/2022 11:20 AM Logan [name]  MRN:  [id number]  Chief Complaint:  Chief Complaint  Patient presents with   Agitation   Follow-up   HPI: This patient is a 37 year old single black male who lives with his mother  in [place]. He has completed the development disability program at [employer] and has nothing to do to occupy his time. The patient had been seen  in our [place] clinic but the mother wanted to bring him here to establish care    The mother returns for follow-up via phone after 3 months regarding his autism and associated behavioral problems.  She states that he went through a rough time after his dental surgery back in September.  He then caught some sort of virus as did his mother.  They pretty much stayed in bed for a week or so and they are both doing better now.  Most the time his  behavior is under good control although he likes to play pranks like turning the lights on and off.  He is not doing anything harmful to self or others.  He is sleeping well.  He is closely followed by his PCP and there are no current medical issues.  Mother thinks the medications continue to help his agitation and sleep. Visit Diagnosis:    ICD-10-CM   1. Autism  F84.0     2. Episodic mood disorder (HCC)  F39       Past Psychiatric History: Long-term outpatient treatment  Past Medical History:  Past Medical History:  Diagnosis Date   ADHD (attention deficit hyperactivity disorder)    Autism    Constipation    and chronic slow transit   Hypertension    Poor appetite    weight loss - tx w/ensure   Urinary incontinence     Past Surgical History:  Procedure Laterality Date   DENTAL RESTORATION/EXTRACTION WITH X-RAY N/A 10/28/2021   Procedure: DENTAL RESTORATION/EXTRACTION OF #9, AND #25 WITH RESIN AND FULL MOUTH X-RAY AND DEBRIDEMENT;  Surgeon: Verdene Lennert, DDS;  Location: [place];  Service: Dentistry;  Laterality: N/A;   MOUTH SURGERY     FILLING/TOOTH REPAIR    Family Psychiatric History: see below  Family History:  Family History  Problem Relation Age of Onset   Autism Brother    Colon cancer Neg Hx    Colon polyps Neg Hx     Social History:  Social History  Socioeconomic History   Marital status: Single    Spouse name: Not on file   Number of children: Not on file   Years of education: Not on file   Highest education level: Not on file  Occupational History   Not on file  Tobacco Use   Smoking status: Never   Smokeless tobacco: Never  Vaping Use   Vaping Use: Never used  Substance and Sexual Activity   Alcohol use: No   Drug use: No   Sexual activity: Never  Other Topics Concern   Not on file  Social History Narrative   DIAGNOSED WITH AUTISM AT AGE 23. HAS ANOTHER AUTISTIC BROTHER. MOTHER CARES FOR HIM.   Social Determinants of Health   Financial  Resource Strain: Not on file  Food Insecurity: Not on file  Transportation Needs: Not on file  Physical Activity: Not on file  Stress: Not on file  Social Connections: Not on file    Allergies: No Known Allergies  Metabolic Disorder Labs: No results found for: "HGBA1C", "MPG" No results found for: "PROLACTIN" No results found for: "CHOL", "TRIG", "HDL", "CHOLHDL", "VLDL", "LDLCALC" No results found for: "TSH"  Therapeutic Level Labs: No results found for: "LITHIUM" No results found for: "VALPROATE" No results found for: "CBMZ"  Current Medications: Current Outpatient Medications  Medication Sig Dispense Refill   acetaminophen (TYLENOL) 500 MG tablet Take 500 mg by mouth every 6 (six) hours as needed (pain.).     clonazepam (KLONOPIN) 2 MG disintegrating tablet Take 1 tablet (2 mg total) by mouth at bedtime. 30 tablet 3   cloNIDine (CATAPRES - DOSED IN MG/24 HR) 0.2 mg/24hr patch Place 0.2 mg onto the skin once a week. Tuesday     mometasone (ELOCON) 0.1 % cream Apply 1 application  topically daily as needed (blemishes (facial)).     OLANZapine zydis (ZYPREXA) 20 MG disintegrating tablet Take 1 tablet (20 mg total) by mouth 4 (four) times daily. 120 tablet 3   polyethylene glycol (MIRALAX / GLYCOLAX) packet Take 17 g by mouth daily. 14 each 0   No current facility-administered medications for this visit.     Musculoskeletal: Strength & Muscle Tone: na Gait & Station: na Patient leans: N/A  Psychiatric Specialty Exam: Review of Systems  Psychiatric/Behavioral:  Positive for agitation and behavioral problems.   All other systems reviewed and are negative.   There were no vitals taken for this visit.There is no height or weight on file to calculate BMI.  General Appearance: NA  Eye Contact:  NA  Speech:   no speech  Volume:  no speech  Mood:  NA  Affect:  NA  Thought Process:  NA  Orientation:  NA  Thought Content: NA   Suicidal Thoughts:  No  Homicidal Thoughts:   No  Memory:  NA  Judgement:  Impaired  Insight:  Lacking  Psychomotor Activity:  NA  Concentration:  Concentration: Poor and Attention Span: Poor  Recall:  NA  Fund of Knowledge: Poor  Language: none  Akathisia:  No  Handed:  Right  AIMS (if indicated): not done  Assets:  Physical Health Resilience Social Support  ADL's:  Impaired  Cognition: Impaired,  Severe  Sleep:  Good   Screenings: Flowsheet Row Admission (Discharged) from 10/28/2021 in [place] No Risk        Assessment and Plan: This patient is a 37 year old male with a history of moderate to severe cognitive impairment autism and history  of agitation.  According to mom his mood and behavior have been stable on his current regimen.  He will continue clonazepam oral disintegrating tablet 2 mg at bedtime for anxiety and sleep and Zyprexa Zydis 20 mg 4 times daily for agitation and mood stabilization.  He will return to see me in 4 months  Collaboration of Care: Collaboration of Care: Primary Care Provider AEB notes will be shared with PCP at mother's request  Patient/Guardian was advised Release of Information must be obtained prior to any record release in order to collaborate their care with an outside provider. Patient/Guardian was advised if they have not already done so to contact the registration department to sign all necessary forms in order for [place] to release information regarding their care.   Consent: Patient/Guardian gives verbal consent for treatment and assignment of benefits for services provided during this visit. Patient/Guardian expressed understanding and agreed to proceed.    Levonne Spiller, MD 03/31/2022, 11:20 AM

## 2022-05-31 DIAGNOSIS — I10 Essential (primary) hypertension: Secondary | ICD-10-CM | POA: Diagnosis not present

## 2022-05-31 DIAGNOSIS — K5901 Slow transit constipation: Secondary | ICD-10-CM | POA: Diagnosis not present

## 2022-05-31 DIAGNOSIS — F840 Autistic disorder: Secondary | ICD-10-CM | POA: Diagnosis not present

## 2022-05-31 DIAGNOSIS — L219 Seborrheic dermatitis, unspecified: Secondary | ICD-10-CM | POA: Diagnosis not present

## 2022-07-29 ENCOUNTER — Telehealth (HOSPITAL_COMMUNITY): Payer: Medicare Other | Admitting: Psychiatry

## 2022-08-02 ENCOUNTER — Telehealth (HOSPITAL_COMMUNITY): Payer: Self-pay | Admitting: *Deleted

## 2022-08-02 ENCOUNTER — Other Ambulatory Visit (HOSPITAL_COMMUNITY): Payer: Self-pay | Admitting: Psychiatry

## 2022-08-02 MED ORDER — OLANZAPINE 20 MG PO TBDP: 20.0000 mg | ORAL_TABLET | Freq: Four times a day (QID) | ORAL | 3 refills | Status: DC

## 2022-08-02 MED ORDER — CLONAZEPAM 2 MG PO TBDP: 2.0000 mg | ORAL_TABLET | Freq: Every day | ORAL | 3 refills | Status: DC

## 2022-08-02 NOTE — Telephone Encounter (Signed)
Patient mother is in the hospital. Per patient sister, patient brother is caring from him right now but sister sister who lives in [place] don't want patient to run out of medications. Per pt sister, she may have to pick patient up for him to live with her and gain POA over him.  Per pt sister, she is needing refills for all of patient medication s provider prescribes. Sister would like for patient medications to be sent to [facility] off of [address] in [place].

## 2022-08-30 DIAGNOSIS — F840 Autistic disorder: Secondary | ICD-10-CM | POA: Diagnosis not present

## 2022-08-30 DIAGNOSIS — I10 Essential (primary) hypertension: Secondary | ICD-10-CM | POA: Diagnosis not present

## 2022-08-30 DIAGNOSIS — K5901 Slow transit constipation: Secondary | ICD-10-CM | POA: Diagnosis not present

## 2022-08-30 DIAGNOSIS — L219 Seborrheic dermatitis, unspecified: Secondary | ICD-10-CM | POA: Diagnosis not present

## 2022-09-27 ENCOUNTER — Other Ambulatory Visit (HOSPITAL_COMMUNITY): Payer: Self-pay | Admitting: Psychiatry

## 2022-12-27 DIAGNOSIS — F840 Autistic disorder: Secondary | ICD-10-CM | POA: Diagnosis not present

## 2022-12-27 DIAGNOSIS — K5901 Slow transit constipation: Secondary | ICD-10-CM | POA: Diagnosis not present

## 2022-12-27 DIAGNOSIS — I10 Essential (primary) hypertension: Secondary | ICD-10-CM | POA: Diagnosis not present

## 2022-12-27 DIAGNOSIS — L219 Seborrheic dermatitis, unspecified: Secondary | ICD-10-CM | POA: Diagnosis not present

## 2023-01-11 ENCOUNTER — Telehealth (HOSPITAL_COMMUNITY): Payer: Medicare Other | Admitting: Psychiatry

## 2023-01-11 ENCOUNTER — Encounter (HOSPITAL_COMMUNITY): Payer: Self-pay | Admitting: Psychiatry

## 2023-01-11 DIAGNOSIS — F840 Autistic disorder: Secondary | ICD-10-CM | POA: Diagnosis not present

## 2023-01-11 MED ORDER — CLONAZEPAM 2 MG PO TBDP: 2.0000 mg | ORAL_TABLET | Freq: Every day | ORAL | 3 refills | Status: DC

## 2023-01-11 MED ORDER — OLANZAPINE 20 MG PO TBDP: 20.0000 mg | ORAL_TABLET | Freq: Four times a day (QID) | ORAL | 3 refills | Status: DC

## 2023-01-11 NOTE — Progress Notes (Signed)
Virtual Visit via Telephone Note  I connected with Logan [name] on 01/11/23 at 10:40 AM EST by telephone and verified that I am speaking with the correct person using two identifiers.  Location: Patient: home Provider: office   I discussed the limitations, risks, security and privacy concerns of performing an evaluation and management service by telephone and the availability of in person appointments. I also discussed with the patient that there may be a patient responsible charge related to this service. The patient expressed understanding and agreed to proceed.      I discussed the assessment and treatment plan with the patient. The patient was provided an opportunity to ask questions and all were answered. The patient agreed with the plan and demonstrated an understanding of the instructions.   The patient was advised to call back or seek an in-person evaluation if the symptoms worsen or if the condition fails to improve as anticipated.  I provided 20 minutes of non-face-to-face time during this encounter.   Logan Ruder, MD  [facility] MD/PA/NP OP Progress Note  01/11/2023 10:54 AM [name]  MRN:  [id number]  Chief Complaint:  Chief Complaint  Patient presents with   Agitation   Follow-up   HPI: This patient is a 37 year old single black male who lives with his mother  in [place]. He has completed the development disability program at [employer] and has nothing to do to occupy his time. The patient had been seen  in our [place] clinic but the mother wanted to bring him here to establish care  The mother returns for follow-up via phone after about 8 months.  She states that she had a stroke back in May and had to be hospitalized.  She lost the use of her left side.  She is now in a rehab facility in [place].  She cannot use her left hand but she is walking again with a walker and hopes to go home fairly soon.  In the interim Logan [name]'s older brother has  been staying with him.  She states it is not going that well because Logan [name] will not listen to him and has tried to open the door and get out and is also stealing food out of the covers and does not sleep well at night.  The mother states that her older son does not keep a strict schedule or the rules that she had for the patient.  I do not think we can add any more medicine because he is maxed out on these dosages but he needs to get back into a regular routine.  The mother also is trying to get him into a group home and I think this is a reasonable idea given her new limitations.  Visit Diagnosis:    ICD-10-CM   1. Autism  F84.0       Past Psychiatric History: Long-term outpatient treatment  Past Medical History:  Past Medical History:  Diagnosis Date   ADHD (attention deficit hyperactivity disorder)    Autism    Constipation    and chronic slow transit   Hypertension    Poor appetite    weight loss - tx w/ensure   Urinary incontinence     Past Surgical History:  Procedure Laterality Date   DENTAL RESTORATION/EXTRACTION WITH X-RAY N/A 10/28/2021   Procedure: DENTAL RESTORATION/EXTRACTION OF #9, AND #25 WITH RESIN AND FULL MOUTH X-RAY AND DEBRIDEMENT;  Surgeon: Boneta Lucks, DDS;  [facility];  Service: Dentistry;  Laterality: N/A;  MOUTH SURGERY     FILLING/TOOTH REPAIR    Family Psychiatric History: See below  Family History:  Family History  Problem Relation Age of Onset   Autism Brother    Colon cancer Neg Hx    Colon polyps Neg Hx     Social History:  Social History   Socioeconomic History   Marital status: Single    Spouse name: Not on file   Number of children: Not on file   Years of education: Not on file   Highest education level: Not on file  Occupational History   Not on file  Tobacco Use   Smoking status: Never   Smokeless tobacco: Never  Vaping Use   Vaping status: Never Used  Substance and Sexual Activity   Alcohol use: No   Drug use:  No   Sexual activity: Never  Other Topics Concern   Not on file  Social History Narrative   DIAGNOSED WITH AUTISM AT AGE 56. HAS ANOTHER AUTISTIC BROTHER. MOTHER CARES FOR HIM.   Social Determinants of Health   Financial Resource Strain: Not on file  Food Insecurity: Not on file  Transportation Needs: Not on file  Physical Activity: Not on file  Stress: Not on file  Social Connections: Not on file    Allergies: No Known Allergies  Metabolic Disorder Labs: No results found for: "HGBA1C", "MPG" No results found for: "PROLACTIN" No results found for: "CHOL", "TRIG", "HDL", "CHOLHDL", "VLDL", "LDLCALC" No results found for: "TSH"  Therapeutic Level Labs: No results found for: "LITHIUM" No results found for: "VALPROATE" No results found for: "CBMZ"  Current Medications: Current Outpatient Medications  Medication Sig Dispense Refill   acetaminophen (TYLENOL) 500 MG tablet Take 500 mg by mouth every 6 (six) hours as needed (pain.).     clonazepam (KLONOPIN) 2 MG disintegrating tablet Take 1 tablet (2 mg total) by mouth at bedtime. 30 tablet 3   cloNIDine (CATAPRES - DOSED IN MG/24 HR) 0.2 mg/24hr patch Place 0.2 mg onto the skin once a week. Tuesday     mometasone (ELOCON) 0.1 % cream Apply 1 application  topically daily as needed (blemishes (facial)).     OLANZapine zydis (ZYPREXA) 20 MG disintegrating tablet Take 1 tablet (20 mg total) by mouth 4 (four) times daily. 120 tablet 3   polyethylene glycol (MIRALAX / GLYCOLAX) packet Take 17 g by mouth daily. 14 each 0   No current facility-administered medications for this visit.     Musculoskeletal: Strength & Muscle Tone: na Gait & Station: na Patient leans: N/A  Psychiatric Specialty Exam: Review of Systems  Psychiatric/Behavioral:  Positive for agitation and behavioral problems.   All other systems reviewed and are negative.   There were no vitals taken for this visit.There is no height or weight on file to calculate  BMI.  General Appearance: NA  Eye Contact:  NA  Speech:  na  Volume:  na  Mood:  NA  Affect:  NA  Thought Process:  NA  Orientation:  NA  Thought Content: NA   Suicidal Thoughts:  No  Homicidal Thoughts:  No  Memory:  NA  Judgement:  Impaired  Insight:  Lacking  Psychomotor Activity:  Restlessness  Concentration:  Concentration: Poor and Attention Span: Poor  Recall:  NA  Fund of Knowledge: Poor  Language: no speech  Akathisia:  NA  Handed:  Right  AIMS (if indicated): not done  Assets:  Physical Health Resilience  ADL's:  Impaired  Cognition: Impaired,  Severe  Sleep:  Fair   Screenings: Flowsheet Row Admission (Discharged) from 10/28/2021 in [place] PERIOPERATIVE AREA  C-SSRS RISK CATEGORY No Risk        Assessment and Plan: This patient is a 37 year old male with a history of severe cognitive impairment autism and agitation.  He is not doing as well with his mother not in the home and I think the group home placement will really be necessary.  For now he will continue clonazepam oral disintegrating tablet 2 mg at bedtime for anxiety and sleep and olanzapine Zydis 20 mg 4 times daily for agitation and mood stabilization.  He will return in 3 months  Collaboration of Care: Collaboration of Care: Primary Care Provider AEB notes to be shared with PCP at mother's request  Patient/Guardian was advised Release of Information must be obtained prior to any record release in order to collaborate their care with an outside provider. Patient/Guardian was advised if they have not already done so to contact the registration department to sign all necessary forms in order for [place] to release information regarding their care.   Consent: Patient/Guardian gives verbal consent for treatment and assignment of benefits for services provided during this visit. Patient/Guardian expressed understanding and agreed to proceed.    Logan Ruder, MD 01/11/2023, 10:54 AM

## 2023-01-19 ENCOUNTER — Other Ambulatory Visit (HOSPITAL_COMMUNITY): Payer: Self-pay | Admitting: Psychiatry

## 2023-01-19 ENCOUNTER — Telehealth (HOSPITAL_COMMUNITY): Payer: Self-pay | Admitting: *Deleted

## 2023-01-19 NOTE — Telephone Encounter (Signed)
Patient sister called stating that her brother is the person that is currently taking care of patient. Per pt sister, the pharmacy informed them that the clonazepam that patient takes at night has been recalled. Per pt sister, her brother informed her that patient is having a lot of confusion and a lot of those side effect (sister did not say the other types of side effects patient was having). Per pt sister, she would like for provider to please call her and let her know what to do. Patient sister number is [phone].

## 2023-01-23 NOTE — Telephone Encounter (Signed)
noted 

## 2023-02-16 ENCOUNTER — Ambulatory Visit (HOSPITAL_COMMUNITY)
Admission: EM | Admit: 2023-02-16 | Discharge: 2023-02-16 | Disposition: A | Discharge status: Home / Self Care | Payer: Medicare Other

## 2023-02-16 ENCOUNTER — Emergency Department (HOSPITAL_COMMUNITY)
Admission: EM | Admit: 2023-02-16 | Discharge: 2023-02-16 | Discharge status: Against Medical Advice | Payer: Medicare Other | Attending: Emergency Medicine | Admitting: Emergency Medicine

## 2023-02-16 DIAGNOSIS — Z5321 Procedure and treatment not carried out due to patient leaving prior to being seen by health care provider: Secondary | ICD-10-CM | POA: Insufficient documentation

## 2023-02-16 DIAGNOSIS — F840 Autistic disorder: Secondary | ICD-10-CM | POA: Insufficient documentation

## 2023-02-16 DIAGNOSIS — Z0279 Encounter for issue of other medical certificate: Secondary | ICD-10-CM | POA: Diagnosis not present

## 2023-02-16 NOTE — Consult Note (Signed)
 Psychiatric consultation received. Will defer psychiatric consultation at this time until medical clearance is obtained from primary EDP team.

## 2023-02-16 NOTE — ED Notes (Signed)
 Pt presented to [facility] to be seen and is totally nonverbal. Pt d/c at this time and pt  brother confirmed they would be going over to [facility] for further evaluation.

## 2023-02-16 NOTE — ED Provider Triage Note (Addendum)
 Emergency Medicine Provider Triage Evaluation Note  Logan [name] , a 38 y.o. male with autism, nonverbal was evaluated in triage.  Pt presents with brother who complains of difficulty caring for his brother.  Brothers denies any new complaints with him.  Reports we will be unable to obtain labs with him.  Review of Systems  Positive:  Negative:   Physical Exam  BP (!) 131/94 (BP Location: Right Arm)   Pulse (!) 104   Resp 20   SpO2 99%  Gen:   Awake, no distress   Resp:  Normal effort  MSK:   Moves extremities without difficulty  Other:    Medical Decision Making  Medically screening exam initiated at 3:45 PM.  Appropriate orders placed.  Logan [name] was informed that the remainder of the evaluation will be completed by another provider, this initial triage assessment does not replace that evaluation, and the importance of remaining in the ED until their evaluation is complete.    *Discussed care with Sarah, patient's care management, reports that patient is now at flight risk and progressively worsening in the past 2 to 3 weeks.  One of his medications has been out of order for some time now.  Unclear as to what this is.    Donnajean Lynwood DEL, PA-C 02/16/23 1603    Donnajean Lynwood DEL, PA-C 02/16/23 1818

## 2023-02-16 NOTE — ED Notes (Signed)
 Pt's brother agitated about wait. Pt without obvious distress. Pt and brother left from triage 2B and declined to wait.

## 2023-02-16 NOTE — Progress Notes (Signed)
   02/16/23 1417  [facility] Triage Screening (Walk-ins at [facility] only)  How Did You Hear About Us ? Family/Friend  What Is the Reason for Your Visit/Call Today? Logan [name] presents to [facility] voluntarily accompanied by his brother.  How Long Has This Been Causing You Problems?  (UTA)  Have You Recently Had Any Thoughts About Hurting Yourself?  (UTA)  Are You Planning to Commit Suicide/Harm Yourself At This time?  (UTA)  Have you Recently Had Thoughts About Hurting Someone Else?  (UTA)  Are You Planning To Harm Someone At This Time?  (UTA)  Physical Abuse  (UTA)  Verbal Abuse  (UTA)  Sexual Abuse  (UTA)  Exploitation of patient/patient's resources  (UTA)  Self-Neglect  (UTA)  Possible abuse reported to:  (UTA)  Are you currently experiencing any auditory, visual or other hallucinations?  (UTA)  Have You Used Any Alcohol or Drugs in the Past 24 Hours?  (UTA)  Do you have any current medical co-morbidities that require immediate attention?  (UTA)  Clinician description of patient physical appearance/behavior:  (UTA)  What Do You Feel Would Help You the Most Today?  (UTA)  If access to [facility] was not available, would you have sought care in the Emergency Department?  (UTA)  Determination of Need  (UTA)  Options For Referral  (UTA)

## 2023-02-16 NOTE — ED Triage Notes (Signed)
 PT BIB for mental health evaluation. Pt brother states pt picked up a pill off floor and took it. Pt states crisis center contacted and advised to come to ER. Pt is non verbal. Brother is poor historian and states patient just needs help.

## 2023-02-16 NOTE — ED Notes (Signed)
 Called pt to get updated vitals and received no response.

## 2023-02-17 ENCOUNTER — Telehealth (HOSPITAL_COMMUNITY): Payer: Self-pay | Admitting: *Deleted

## 2023-02-17 NOTE — Telephone Encounter (Signed)
 The pt was seen in the Ed yesterday and they did not stay for eval. Brother told the nurse that they were out of some of his meds. Brother needs to call us to go over meds and determine what he is actually taking

## 2023-02-17 NOTE — Telephone Encounter (Signed)
 Patient mother called stating that patient is not sleeping at night. Per pt mother, that patient is taking all of his medication and agitated and up all night. Per pt mother, patient is still living with his brother. Per pt mother, she don't know if its but to patient taking this medication for so long or not but patient is not sleeping at night.   [phone]

## 2023-02-20 NOTE — Telephone Encounter (Signed)
 LMOM

## 2023-03-09 DIAGNOSIS — F73 Profound intellectual disabilities: Secondary | ICD-10-CM | POA: Diagnosis not present

## 2023-04-11 DIAGNOSIS — Z0001 Encounter for general adult medical examination with abnormal findings: Secondary | ICD-10-CM | POA: Diagnosis not present

## 2023-04-11 DIAGNOSIS — I10 Essential (primary) hypertension: Secondary | ICD-10-CM | POA: Diagnosis not present

## 2023-04-11 DIAGNOSIS — H6121 Impacted cerumen, right ear: Secondary | ICD-10-CM | POA: Diagnosis not present

## 2023-04-11 DIAGNOSIS — F840 Autistic disorder: Secondary | ICD-10-CM | POA: Diagnosis not present

## 2023-04-11 DIAGNOSIS — K5900 Constipation, unspecified: Secondary | ICD-10-CM | POA: Diagnosis not present

## 2023-06-05 ENCOUNTER — Other Ambulatory Visit (HOSPITAL_COMMUNITY): Payer: Self-pay | Admitting: Psychiatry

## 2023-06-05 MED ORDER — OLANZAPINE 20 MG PO TBDP: 20.0000 mg | ORAL_TABLET | Freq: Four times a day (QID) | ORAL | 3 refills | Status: AC

## 2023-06-05 MED ORDER — CLONAZEPAM 2 MG PO TBDP: 2.0000 mg | ORAL_TABLET | Freq: Every day | ORAL | 3 refills | Status: AC

## 2023-06-08 ENCOUNTER — Telehealth (INDEPENDENT_AMBULATORY_CARE_PROVIDER_SITE_OTHER): Admitting: Psychiatry

## 2023-06-08 ENCOUNTER — Encounter (HOSPITAL_COMMUNITY): Payer: Self-pay | Admitting: Psychiatry

## 2023-06-08 DIAGNOSIS — F840 Autistic disorder: Secondary | ICD-10-CM

## 2023-06-08 DIAGNOSIS — F39 Unspecified mood [affective] disorder: Secondary | ICD-10-CM

## 2023-06-08 NOTE — Progress Notes (Signed)
 Virtual Visit via Video Note  I connected with Thelma Viana Becka on 06/08/23 at  9:40 AM EDT by a video enabled telemedicine application and verified that I am speaking with the correct person using two identifiers.  Location: Patient: home Provider: office   I discussed the limitations of evaluation and management by telemedicine and the availability of in person appointments. The patient expressed understanding and agreed to proceed.      I discussed the assessment and treatment plan with the patient. The patient was provided an opportunity to ask questions and all were answered. The patient agreed with the plan and demonstrated an understanding of the instructions.   The patient was advised to call back or seek an in-person evaluation if the symptoms worsen or if the condition fails to improve as anticipated.  I provided 20 minutes of non-face-to-face time during this encounter.   Alfredia Annas, MD  [facility] MD/PA/NP OP Progress Note  06/08/2023 11:28 AM Leon Montoya Balch  MRN:  [id number]  Chief Complaint:  Chief Complaint  Patient presents with   Agitation   Follow-up   HPI: This patient is a 38 year old single white male who is currently living with his brother in [place].  His mother has custody of him but she is in a rehab center in [place] after having a stroke last May.  The patient has a history of autism spectrum disorder agitation associated with autism and moderate cognitive delay.  The patient returns for follow-up after about 6 months.  A case manager for Medicaid had called us  regarding placement for him and I realized we had not seen him in quite a while.  His brother had been staying with him since the mother's stroke last year.  He states overall he is doing okay but he is very difficult to manage.  The brother is trying to get the mother to come home from the rehab center and he cannot manage both people.  He states that they have found a placement for him in a  local group home.  The patient had been off his medications for several months due to unclear reasons.  Once I heard this earlier this week I sent in the both the olanzapine  and the clonazepam .  The patient is not eating well and may be constipated.  According to the brother he is at 120 pounds and he used to be 135 pounds.  I strongly urged him to contact the primary physician regarding this.  The brother states that Orton has been sleeping better since he got back on his medications.  He still paces around all day.  He was seen on screen and did look a bit disheveled.  He would not stay on screen very long.  Since we have only just gotten him started back on medicine I am hoping this will help with his agitation and appetite. Visit Diagnosis:    ICD-10-CM   1. Autism  F84.0     2. Episodic mood disorder (HCC)  F39       Past Psychiatric History: Long-term outpatient treatment  Past Medical History:  Past Medical History:  Diagnosis Date   ADHD (attention deficit hyperactivity disorder)    Autism    Constipation    and chronic slow transit   Hypertension    Poor appetite    weight loss - tx w/ensure   Urinary incontinence     Past Surgical History:  Procedure Laterality Date   DENTAL RESTORATION/EXTRACTION WITH X-RAY N/A 10/28/2021  Procedure: DENTAL RESTORATION/EXTRACTION OF #9, AND #25 WITH RESIN AND FULL MOUTH X-RAY AND DEBRIDEMENT;  Surgeon: Holmes Lusher, DDS;  Location: [facility] OR;  Service: Dentistry;  Laterality: N/A;   MOUTH SURGERY     FILLING/TOOTH REPAIR    Family Psychiatric History: See below  Family History:  Family History  Problem Relation Age of Onset   Autism Brother    Colon cancer Neg Hx    Colon polyps Neg Hx     Social History:  Social History   Socioeconomic History   Marital status: Single    Spouse name: Not on file   Number of children: Not on file   Years of education: Not on file   Highest education level: Not on file  Occupational  History   Not on file  Tobacco Use   Smoking status: Never   Smokeless tobacco: Never  Vaping Use   Vaping status: Never Used  Substance and Sexual Activity   Alcohol use: No   Drug use: No   Sexual activity: Never  Other Topics Concern   Not on file  Social History Narrative   DIAGNOSED WITH AUTISM AT AGE 38. HAS ANOTHER AUTISTIC BROTHER. MOTHER CARES FOR HIM.   Social Drivers of Corporate investment banker Strain: Not on file  Food Insecurity: Not on file  Transportation Needs: Not on file  Physical Activity: Not on file  Stress: Not on file  Social Connections: Not on file    Allergies: No Known Allergies  Metabolic Disorder Labs: No results found for: "HGBA1C", "MPG" No results found for: "PROLACTIN" No results found for: "CHOL", "TRIG", "HDL", "CHOLHDL", "VLDL", "LDLCALC" No results found for: "TSH"  Therapeutic Level Labs: No results found for: "LITHIUM" No results found for: "VALPROATE" No results found for: "CBMZ"  Current Medications: Current Outpatient Medications  Medication Sig Dispense Refill   acetaminophen  (TYLENOL ) 500 MG tablet Take 500 mg by mouth every 6 (six) hours as needed (pain.).     clonazepam  (KLONOPIN ) 2 MG disintegrating tablet Take 1 tablet (2 mg total) by mouth at bedtime. 30 tablet 3   cloNIDine (CATAPRES - DOSED IN MG/24 HR) 0.2 mg/24hr patch Place 0.2 mg onto the skin once a week. Tuesday     mometasone (ELOCON) 0.1 % cream Apply 1 application  topically daily as needed (blemishes (facial)).     OLANZapine  zydis (ZYPREXA ) 20 MG disintegrating tablet Take 1 tablet (20 mg total) by mouth 4 (four) times daily. 120 tablet 3   polyethylene glycol (MIRALAX  / GLYCOLAX ) packet Take 17 g by mouth daily. 14 each 0   No current facility-administered medications for this visit.     Musculoskeletal: Strength & Muscle Tone: within normal limits Gait & Station: normal Patient leans: N/A  Psychiatric Specialty Exam: Review of Systems   Psychiatric/Behavioral:  Positive for agitation and sleep disturbance.   All other systems reviewed and are negative.   There were no vitals taken for this visit.There is no height or weight on file to calculate BMI.  General Appearance: Disheveled  Eye Contact:  None  Speech: Nonverbal  Volume: Nonverbal  Mood:  NA  Affect:  Flat  Thought Process:  NA  Orientation:  NA  Thought Content: NA   Suicidal Thoughts:  No  Homicidal Thoughts:  No  Memory:  NA  Judgement:  Impaired  Insight:  Lacking  Psychomotor Activity:  Restlessness  Concentration:  Concentration: Poor and Attention Span: Poor  Recall:  Poor  Fund of Knowledge: Poor  Language: none  Akathisia:  No  Handed:  Right  AIMS (if indicated): not done  Assets:  Physical Health Resilience Social Support  ADL's:  Impaired  Cognition: Impaired,  Severe  Sleep:  Fair   Screenings: Flowsheet Row Admission (Discharged) from 10/28/2021 in [place] PERIOPERATIVE AREA  C-SSRS RISK CATEGORY No Risk        Assessment and Plan:  This patient is a 38 year old male with a history of severe cognitive impairment autism and agitation.  Since his mother is not able to care for him I think group home placement is desirable.  We have just restarted clonazepam  oral disintegrating tablet 2 mg at bedtime for anxiety and sleep and olanzapine  Zydis 20 mg 4 times daily for agitation and mood stabilization.  He will return to see me in 3 months Collaboration of Care: Collaboration of Care: Primary Care Provider AEB notes will be shared with PCP and Other notes will be shared with Medicaid case management at guardian's request  Patient/Guardian was advised Release of Information must be obtained prior to any record release in order to collaborate their care with an outside provider. Patient/Guardian was advised if they have not already done so to contact the registration department to sign all necessary forms in order for us  to release  information regarding their care.   Consent: Patient/Guardian gives verbal consent for treatment and assignment of benefits for services provided during this visit. Patient/Guardian expressed understanding and agreed to proceed.    Alfredia Annas, MD 06/08/2023, 11:28 AM

## 2023-07-13 DIAGNOSIS — H6120 Impacted cerumen, unspecified ear: Secondary | ICD-10-CM | POA: Diagnosis not present

## 2023-07-13 DIAGNOSIS — Z7689 Persons encountering health services in other specified circumstances: Secondary | ICD-10-CM | POA: Diagnosis not present

## 2023-07-13 DIAGNOSIS — F840 Autistic disorder: Secondary | ICD-10-CM | POA: Diagnosis not present

## 2023-07-13 DIAGNOSIS — K5900 Constipation, unspecified: Secondary | ICD-10-CM | POA: Diagnosis not present

## 2023-07-13 DIAGNOSIS — G4700 Insomnia, unspecified: Secondary | ICD-10-CM | POA: Diagnosis not present

## 2023-10-25 ENCOUNTER — Other Ambulatory Visit (HOSPITAL_COMMUNITY)
Admission: RE | Admit: 2023-10-25 | Discharge: 2023-10-25 | Disposition: A | Discharge status: Home / Self Care | Source: Ambulatory Visit | Attending: Family Medicine | Admitting: Family Medicine

## 2023-10-25 DIAGNOSIS — I10 Essential (primary) hypertension: Secondary | ICD-10-CM | POA: Insufficient documentation

## 2023-11-01 ENCOUNTER — Encounter: Payer: Self-pay | Admitting: Internal Medicine

## 2023-11-23 ENCOUNTER — Ambulatory Visit: Admission: EM | Admit: 2023-11-23 | Discharge: 2023-11-23 | Disposition: A | Discharge status: Home / Self Care

## 2023-11-23 ENCOUNTER — Encounter: Payer: Self-pay | Admitting: Emergency Medicine

## 2023-11-23 ENCOUNTER — Other Ambulatory Visit: Payer: Self-pay

## 2023-11-23 DIAGNOSIS — K0510 Chronic gingivitis, plaque induced: Secondary | ICD-10-CM | POA: Diagnosis not present

## 2023-11-23 MED ORDER — AMOXICILLIN-POT CLAVULANATE 875-125 MG PO TABS: 1.0000 | ORAL_TABLET | Freq: Two times a day (BID) | ORAL | 0 refills | Status: AC

## 2023-11-23 NOTE — Discharge Instructions (Signed)
 We are treating Logan [name] today for a dental abscess.  Give him the Augmentin as prescribed.  If his appetite does not return to normal after completing the antibiotic medicine, follow up with PCP and/or Dentist.

## 2023-11-23 NOTE — ED Provider Notes (Signed)
 [facility]    CSN: [id number] Arrival date & time: 11/23/23  1328      History   Chief Complaint Chief Complaint  Logan [name] presents with   Dental Pain    HPI Logan [name] is a 38 y.o. male.   Logan [name] presents today with caregiver for several day history of decreased appetite, making loud grunting/angry noises, and chewing on one side of his mouth.  No known fevers or mouth drainage.  Logan [name] is nonverbal at baseline and lives in group home.  Caregiver present with Logan [name] states guardian will not sign paperwork for Logan [name] to have dental procedure with dentist.  Reports history of dental abscess.      Past Medical History:  Diagnosis Date   ADHD (attention deficit hyperactivity disorder)    Autism    Constipation    and chronic slow transit   Hypertension    Poor appetite    weight loss - tx w/ensure   Urinary incontinence     Logan [name] Active Problem List   Diagnosis Date Noted   Encopresis 03/14/2018   Autistic disorder 05/31/2011   Attention deficit hyperactivity disorder (ADHD) 05/31/2011    Past Surgical History:  Procedure Laterality Date   DENTAL RESTORATION/EXTRACTION WITH X-RAY N/A 10/28/2021   Procedure: DENTAL RESTORATION/EXTRACTION OF #9, AND #25 WITH RESIN AND FULL MOUTH X-RAY AND DEBRIDEMENT;  Surgeon: Jeni Fallow, DDS;  Location: [facility] OR;  Service: Dentistry;  Laterality: N/A;   MOUTH SURGERY     FILLING/TOOTH REPAIR       Home Medications    Prior to Admission medications   Medication Sig Start Date End Date Taking? Authorizing Provider  amitriptyline (ELAVIL) 50 MG tablet Take 50 mg by mouth at bedtime. 10/25/23  Yes [provider]  amoxicillin -clavulanate (AUGMENTIN) 875-125 MG tablet Take 1 tablet by mouth 2 (two) times daily for 7 days. 11/23/23 11/30/23 Yes Chandra Harlene LABOR, NP  acetaminophen  (TYLENOL ) 500 MG tablet Take 500 mg by mouth every 6 (six) hours as needed (pain.).    [provider]   clonazepam  (KLONOPIN ) 2 MG disintegrating tablet Take 1 tablet (2 mg total) by mouth at bedtime. 06/05/23   Okey Barnie SAUNDERS, MD  cloNIDine (CATAPRES - DOSED IN MG/24 HR) 0.2 mg/24hr patch Place 0.2 mg onto the skin once a week. Tuesday    [provider]  mometasone (ELOCON) 0.1 % cream Apply 1 application  topically daily as needed (blemishes (facial)).    [provider]  OLANZapine  zydis (ZYPREXA ) 20 MG disintegrating tablet Take 1 tablet (20 mg total) by mouth 4 (four) times daily. 06/05/23   Okey Barnie SAUNDERS, MD  polyethylene glycol (MIRALAX  / GLYCOLAX ) packet Take 17 g by mouth daily. 01/15/18   Mesner, Selinda, MD    Family History Family History  Problem Relation Age of Onset   Autism Brother    Colon cancer Neg Hx    Colon polyps Neg Hx     Social History Social History   Tobacco Use   Smoking status: Never   Smokeless tobacco: Never  Vaping Use   Vaping status: Never Used  Substance Use Topics   Alcohol use: No   Drug use: No     Allergies   Logan [name] has no known allergies.   Review of Systems Review of Systems Per HPI  Physical Exam Triage Vital Signs ED Triage Vitals [11/23/23 1347]  Encounter Vitals Group     BP (!) 127/90     Girls Systolic BP  Percentile      Girls Diastolic BP Percentile      Boys Systolic BP Percentile      Boys Diastolic BP Percentile      Pulse Rate 93     Resp 20     Temp (!) 97.2 F (36.2 C)     Temp Source Temporal     SpO2 97 %     Weight      Height      Head Circumference      Peak Flow      Pain Score      Pain Loc      Pain Education      Exclude from Growth Chart    No data found.  Updated Vital Signs BP (!) 127/90 (BP Location: Right Arm)   Pulse 93   Temp (!) 97.2 F (36.2 C) (Temporal)   Resp 20   SpO2 97%   Visual Acuity Right Eye Distance:   Left Eye Distance:   Bilateral Distance:    Right Eye Near:   Left Eye Near:    Bilateral Near:     Physical Exam Vitals and nursing  note reviewed.  Constitutional:      General: Logan [name] is not in acute distress.    Appearance: Normal appearance. Logan [name] is not toxic-appearing.  HENT:     Head: Normocephalic and atraumatic.     Right Ear: External ear normal.     Left Ear: External ear normal.     Mouth/Throat:     Mouth: Mucous membranes are moist.     Comments: Dental exam difficult due to Logan [name] lack of participation; significant gingivitis noted to lower mid gumline.  Unable to view entire mouth or oropharynx.  Cardiovascular:     Rate and Rhythm: Normal rate.  Pulmonary:     Effort: Pulmonary effort is normal. No respiratory distress.  Musculoskeletal:     Cervical back: Normal range of motion.  Lymphadenopathy:     Cervical: No cervical adenopathy.  Skin:    General: Skin is warm and dry.     Capillary Refill: Capillary refill takes less than 2 seconds.     Coloration: Skin is not jaundiced or pale.     Findings: No erythema.  Neurological:     Mental Status: Logan [name] is alert and oriented to person, place, and time.  Psychiatric:        Behavior: Behavior is cooperative.      UC Treatments / Results  Labs (all labs ordered are listed, but only abnormal results are displayed) Labs Reviewed - No data to display  EKG   Radiology No results found.  Procedures Procedures (including critical care time)  Medications Ordered in UC Medications - No data to display  Initial Impression / Assessment and Plan / UC Course  I have reviewed the triage vital signs and the nursing notes.  Pertinent labs & imaging results that were available during my care of the Logan [name] were reviewed by me and considered in my medical decision making (see chart for details).   Logan [name] is mildly hypertensive in urgent care today.  1. Gingivitis Treat with Augmentin twice daily for 7 days Recommended follow up with Dentist and/or PCP if Logan [name] does not return to normal   The Logan [name]'s caregiver was given the opportunity to ask  questions.  All questions answered to their satisfaction.  The Logan [name]'s caregiver is in agreement to this plan.   Final Clinical Impressions(s) / UC Diagnoses  Final diagnoses:  Gingivitis     Discharge Instructions      We are treating Logan [name] today for a dental abscess.  Give him the Augmentin as prescribed.  If his appetite does not return to normal after completing the antibiotic medicine, follow up with PCP and/or Dentist.    ED Prescriptions     Medication Sig Dispense Auth. Provider   amoxicillin -clavulanate (AUGMENTIN) 875-125 MG tablet Take 1 tablet by mouth 2 (two) times daily for 7 days. 14 tablet Chandra Harlene LABOR, NP      PDMP not reviewed this encounter.   Chandra Harlene LABOR, NP 11/23/23 1424

## 2023-11-23 NOTE — ED Triage Notes (Addendum)
 Pt group home reports pt is nonverbal and has had decreased appetite last several days, has missed several dental appointments in the past few months, and is concerned for possible dental infection. Reports hx of dental abscess.

## 2023-12-06 ENCOUNTER — Encounter: Payer: Self-pay | Admitting: Internal Medicine

## 2023-12-06 ENCOUNTER — Ambulatory Visit: Admitting: Internal Medicine

## 2023-12-06 VITALS — BP 131/87 | HR 97 | Temp 97.1°F | Ht 66.0 in | Wt 133.1 lb

## 2023-12-06 DIAGNOSIS — K5904 Chronic idiopathic constipation: Secondary | ICD-10-CM | POA: Diagnosis not present

## 2023-12-06 MED ORDER — POLYETHYLENE GLYCOL 3350 17 GM/SCOOP PO POWD: ORAL | 11 refills | Status: AC

## 2023-12-06 NOTE — Progress Notes (Signed)
 Primary Care Physician:  Teresa Jenkins Jansky, FNP Primary Gastroenterologist:  Dr. Cindie  Chief Complaint  Patient presents with   New Patient (Initial Visit)    Patient here today due as a new patient due to constipation. He is not currently on any anti constipation medications.     HPI:   Logan [name] is a 38 y.o. male who presents to the clinic today by referral from his PCP Clement Teresa for evaluation.  Patient has underlying autism, nonverbal.  Majority of HPI taken from caregiver from group home who accompanies him today.  Reports patient will have a bowel movement 1-2 times a week.  Appears that patient strains quite hard during bowel movements to try to push out his stool.  No melena hematochezia.  Unsure about abdominal pain given patient is nonverbal.  Reports he was placed on fiber which did not help.  According to PCP note he was started on MiraLAX  and docusate though caregiver does not believe that he has been doing this.  Past Medical History:  Diagnosis Date   ADHD (attention deficit hyperactivity disorder)    Autism    Constipation    and chronic slow transit   Hypertension    Poor appetite    weight loss - tx w/ensure   Urinary incontinence     Past Surgical History:  Procedure Laterality Date   DENTAL RESTORATION/EXTRACTION WITH X-RAY N/A 10/28/2021   Procedure: DENTAL RESTORATION/EXTRACTION OF #9, AND #25 WITH RESIN AND FULL MOUTH X-RAY AND DEBRIDEMENT;  Surgeon: Jeni Fallow, DDS;  Location: [facility] OR;  Service: Dentistry;  Laterality: N/A;   MOUTH SURGERY     FILLING/TOOTH REPAIR    Current Outpatient Medications  Medication Sig Dispense Refill   amitriptyline (ELAVIL) 50 MG tablet Take 50 mg by mouth at bedtime.     clonazepam  (KLONOPIN ) 2 MG disintegrating tablet Take 1 tablet (2 mg total) by mouth at bedtime. 30 tablet 3   cloNIDine (CATAPRES - DOSED IN MG/24 HR) 0.2 mg/24hr patch Place 0.2 mg onto the skin once a week. Tuesday (Patient  taking differently: Place 0.1 mg onto the skin once a week. Tuesday)     lactose free nutrition (BOOST) LIQD Take 237 mLs by mouth 3 (three) times daily between meals.     mometasone (ELOCON) 0.1 % cream Apply 1 application  topically daily as needed (blemishes (facial)).     OLANZapine  zydis (ZYPREXA ) 20 MG disintegrating tablet Take 1 tablet (20 mg total) by mouth 4 (four) times daily. 120 tablet 3   No current facility-administered medications for this visit.    Allergies as of 12/06/2023   (No Known Allergies)    Family History  Problem Relation Age of Onset   Autism Brother    Colon cancer Neg Hx    Colon polyps Neg Hx     Social History   Socioeconomic History   Marital status: Single    Spouse name: Not on file   Number of children: Not on file   Years of education: Not on file   Highest education level: Not on file  Occupational History   Not on file  Tobacco Use   Smoking status: Never   Smokeless tobacco: Never  Vaping Use   Vaping status: Never Used  Substance and Sexual Activity   Alcohol use: No   Drug use: No   Sexual activity: Never  Other Topics Concern   Not on file  Social History Narrative   DIAGNOSED WITH  AUTISM AT AGE 62. HAS ANOTHER AUTISTIC BROTHER. MOTHER CARES FOR HIM.   Social Drivers of Corporate investment banker Strain: Not on file  Food Insecurity: Not on file  Transportation Needs: Not on file  Physical Activity: Not on file  Stress: Not on file  Social Connections: Not on file  Intimate Partner Violence: Not on file    Subjective: Review of Systems  Constitutional:  Negative for chills and fever.  HENT:  Negative for congestion and hearing loss.   Eyes:  Negative for blurred vision and double vision.  Respiratory:  Negative for cough and shortness of breath.   Cardiovascular:  Negative for chest pain and palpitations.  Gastrointestinal:  Negative for abdominal pain, blood in stool, constipation, diarrhea, heartburn, melena  and vomiting.  Genitourinary:  Negative for dysuria and urgency.  Musculoskeletal:  Negative for joint pain and myalgias.  Skin:  Negative for itching and rash.  Neurological:  Negative for dizziness and headaches.  Psychiatric/Behavioral:  Negative for depression. The patient is not nervous/anxious.        Objective: BP 131/87 (BP Location: Left Arm, Patient Position: Sitting, Cuff Size: Normal)   Pulse 97   Temp (!) 97.1 F (36.2 C) (Temporal)   Ht 5' 6 (1.676 m)   Wt 133 lb 1.6 oz (60.4 kg)   BMI 21.48 kg/m  Physical Exam Constitutional:      Appearance: Normal appearance.  HENT:     Head: Normocephalic and atraumatic.  Eyes:     Extraocular Movements: Extraocular movements intact.     Conjunctiva/sclera: Conjunctivae normal.  Cardiovascular:     Rate and Rhythm: Normal rate and regular rhythm.  Pulmonary:     Effort: Pulmonary effort is normal.     Breath sounds: Normal breath sounds.  Abdominal:     General: Bowel sounds are normal.     Palpations: Abdomen is soft.  Musculoskeletal:        General: Normal range of motion.     Cervical back: Normal range of motion and neck supple.  Skin:    General: Skin is warm.  Neurological:     General: No focal deficit present.     Mental Status: He is alert.     Comments: Nonverbal  Psychiatric:        Mood and Affect: Mood normal.        Behavior: Behavior normal.      Assessment: *Chronic idiopathic constipation  Plan: Start MiraLAX  17 g once daily.  If no improvement after 1 week increase to twice daily.  Counseled on staying well-hydrated.  Follow-up in 6 to 8 weeks.  If no improvement can consider trial of Linzess.  Thank you Clement Pizza for the kind referral.  12/06/2023 11:03 AM

## 2023-12-06 NOTE — Patient Instructions (Signed)
 For your chronic constipation I want you to start taking MiraLAX  17 g once daily.  You can dissolve this in 4 to 8 ounces of water  or Gatorade.  If not improved after 1 week then I want you to increase dosage to 17 g twice daily.  Follow-up in 6 to 8 weeks.  It was very nice seeing you both today.  Dr. Cindie

## 2023-12-14 DIAGNOSIS — F72 Severe intellectual disabilities: Secondary | ICD-10-CM | POA: Diagnosis not present

## 2023-12-14 DIAGNOSIS — F840 Autistic disorder: Secondary | ICD-10-CM | POA: Diagnosis not present

## 2024-01-24 ENCOUNTER — Ambulatory Visit: Admitting: Gastroenterology

## 2024-02-01 DIAGNOSIS — F72 Severe intellectual disabilities: Secondary | ICD-10-CM | POA: Diagnosis not present

## 2024-02-01 DIAGNOSIS — F840 Autistic disorder: Secondary | ICD-10-CM | POA: Diagnosis not present

## 2024-02-20 ENCOUNTER — Other Ambulatory Visit (HOSPITAL_COMMUNITY)
Admission: RE | Admit: 2024-02-20 | Discharge: 2024-02-20 | Disposition: A | Discharge status: Home / Self Care | Source: Ambulatory Visit | Attending: Family Medicine | Admitting: Family Medicine

## 2024-02-20 DIAGNOSIS — Z79899 Other long term (current) drug therapy: Secondary | ICD-10-CM | POA: Diagnosis present

## 2024-02-20 LAB — COMPREHENSIVE METABOLIC PANEL WITH GFR
ALT: 17 U/L (ref 0–44)
AST: 28 U/L (ref 15–41)
Albumin: 4.6 g/dL (ref 3.5–5.0)
Alkaline Phosphatase: 88 U/L (ref 38–126)
Anion gap: 11 (ref 5–15)
BUN: 11 mg/dL (ref 6–20)
CO2: 27 mmol/L (ref 22–32)
Calcium: 9.4 mg/dL (ref 8.9–10.3)
Chloride: 106 mmol/L (ref 98–111)
Creatinine, Ser: 0.83 mg/dL (ref 0.61–1.24)
GFR, Estimated: >60 mL/min
Glucose, Bld: 99 mg/dL (ref 70–99)
Potassium: 4.4 mmol/L (ref 3.5–5.1)
Sodium: 143 mmol/L (ref 135–145)
Total Bilirubin: 0.4 mg/dL (ref 0.0–1.2)
Total Protein: 7.3 g/dL (ref 6.5–8.1)

## 2024-02-20 LAB — CBC WITH DIFFERENTIAL/PLATELET
Abs Immature Granulocytes: 0.01 K/uL (ref 0.00–0.07)
Basophils Absolute: 0 K/uL (ref 0.0–0.1)
Basophils Relative: 0 %
Eosinophils Absolute: 0 K/uL (ref 0.0–0.5)
Eosinophils Relative: 0 %
HCT: 42.8 % (ref 39.0–52.0)
Hemoglobin: 13.9 g/dL (ref 13.0–17.0)
Immature Granulocytes: 0 %
Lymphocytes Relative: 19 %
Lymphs Abs: 0.9 K/uL (ref 0.7–4.0)
MCH: 29.5 pg (ref 26.0–34.0)
MCHC: 32.5 g/dL (ref 30.0–36.0)
MCV: 90.9 fL (ref 80.0–100.0)
Monocytes Absolute: 0.3 K/uL (ref 0.1–1.0)
Monocytes Relative: 6 %
Neutro Abs: 3.6 K/uL (ref 1.7–7.7)
Neutrophils Relative %: 75 %
Platelets: 248 K/uL (ref 150–400)
RBC: 4.71 MIL/uL (ref 4.22–5.81)
RDW: 12.7 % (ref 11.5–15.5)
WBC: 4.9 K/uL (ref 4.0–10.5)
nRBC: 0 % (ref 0.0–0.2)

## 2024-02-20 LAB — LIPID PANEL
Cholesterol: 140 mg/dL (ref 0–200)
HDL: 55 mg/dL
LDL Cholesterol: 79 mg/dL (ref 0–99)
Total CHOL/HDL Ratio: 2.6 ratio
Triglycerides: 31 mg/dL
VLDL: 6 mg/dL (ref 0–40)
